# Patient Record
Sex: Male | Born: 1955 | Hispanic: No | Marital: Married | Smoking: Never smoker
Health system: Southern US, Community
[De-identification: ages and names within clinical notes are randomized; demographics above are authoritative.]

---

## 2002-12-17 HISTORY — PX: LASIK: SHX215

## 2003-04-13 ENCOUNTER — Encounter (INDEPENDENT_AMBULATORY_CARE_PROVIDER_SITE_OTHER): Payer: Self-pay | Admitting: Specialist

## 2003-04-13 ENCOUNTER — Observation Stay (HOSPITAL_COMMUNITY): Admission: RE | Admit: 2003-04-13 | Discharge: 2003-04-14 | Payer: Self-pay | Admitting: General Surgery

## 2003-04-13 ENCOUNTER — Encounter: Payer: Self-pay | Admitting: General Surgery

## 2003-04-18 ENCOUNTER — Inpatient Hospital Stay (HOSPITAL_COMMUNITY): Admission: EM | Admit: 2003-04-18 | Discharge: 2003-04-19 | Payer: Self-pay | Admitting: Emergency Medicine

## 2003-04-18 ENCOUNTER — Encounter: Payer: Self-pay | Admitting: General Surgery

## 2003-12-18 HISTORY — PX: CHOLECYSTECTOMY, LAPAROSCOPIC: SHX56

## 2005-08-30 ENCOUNTER — Ambulatory Visit: Payer: Self-pay | Admitting: Sports Medicine

## 2005-09-19 ENCOUNTER — Ambulatory Visit: Payer: Self-pay | Admitting: Family Medicine

## 2005-10-02 ENCOUNTER — Ambulatory Visit (HOSPITAL_COMMUNITY): Admission: RE | Admit: 2005-10-02 | Discharge: 2005-10-02 | Payer: Self-pay | Admitting: Sports Medicine

## 2006-04-19 ENCOUNTER — Ambulatory Visit: Payer: Self-pay | Admitting: Family Medicine

## 2006-08-15 ENCOUNTER — Ambulatory Visit: Payer: Self-pay | Admitting: Sports Medicine

## 2007-02-13 DIAGNOSIS — D126 Benign neoplasm of colon, unspecified: Secondary | ICD-10-CM | POA: Insufficient documentation

## 2008-03-04 ENCOUNTER — Ambulatory Visit: Payer: Self-pay | Admitting: Family Medicine

## 2008-03-04 ENCOUNTER — Encounter: Payer: Self-pay | Admitting: Sports Medicine

## 2008-03-04 DIAGNOSIS — J984 Other disorders of lung: Secondary | ICD-10-CM | POA: Insufficient documentation

## 2008-03-04 HISTORY — DX: Other disorders of lung: J98.4

## 2008-03-04 LAB — CONVERTED CEMR LAB
Albumin: 4.2 g/dL (ref 3.5–5.2)
Alkaline Phosphatase: 64 units/L (ref 39–117)
CO2: 23 meq/L (ref 19–32)
Chloride: 107 meq/L (ref 96–112)
Cholesterol: 202 mg/dL — ABNORMAL HIGH (ref 0–200)
Glucose, Bld: 86 mg/dL (ref 70–99)
LDL Cholesterol: 125 mg/dL — ABNORMAL HIGH (ref 0–99)
Potassium: 4.7 meq/L (ref 3.5–5.3)
RBC: 5.12 M/uL (ref 4.22–5.81)
Sodium: 141 meq/L (ref 135–145)
Total Protein: 7.1 g/dL (ref 6.0–8.3)
Triglycerides: 168 mg/dL — ABNORMAL HIGH (ref <150)
WBC: 7 10*3/uL (ref 4.0–10.5)

## 2008-06-19 ENCOUNTER — Emergency Department (HOSPITAL_COMMUNITY): Admission: EM | Admit: 2008-06-19 | Discharge: 2008-06-19 | Payer: Self-pay | Admitting: Emergency Medicine

## 2008-09-17 ENCOUNTER — Telehealth: Payer: Self-pay | Admitting: *Deleted

## 2009-03-24 ENCOUNTER — Ambulatory Visit: Payer: Self-pay | Admitting: Family Medicine

## 2009-03-24 DIAGNOSIS — R0609 Other forms of dyspnea: Secondary | ICD-10-CM | POA: Insufficient documentation

## 2009-03-24 DIAGNOSIS — R0989 Other specified symptoms and signs involving the circulatory and respiratory systems: Secondary | ICD-10-CM

## 2009-03-24 DIAGNOSIS — J309 Allergic rhinitis, unspecified: Secondary | ICD-10-CM | POA: Insufficient documentation

## 2009-04-29 ENCOUNTER — Ambulatory Visit: Payer: Self-pay | Admitting: Family Medicine

## 2009-04-29 DIAGNOSIS — R3915 Urgency of urination: Secondary | ICD-10-CM | POA: Insufficient documentation

## 2009-05-02 ENCOUNTER — Encounter: Payer: Self-pay | Admitting: Family Medicine

## 2009-05-13 ENCOUNTER — Encounter: Payer: Self-pay | Admitting: Family Medicine

## 2009-05-13 ENCOUNTER — Ambulatory Visit: Payer: Self-pay | Admitting: Family Medicine

## 2009-05-13 LAB — CONVERTED CEMR LAB
Bilirubin Urine: NEGATIVE
Ketones, urine, test strip: NEGATIVE
Urobilinogen, UA: 0.2

## 2009-05-17 ENCOUNTER — Encounter: Payer: Self-pay | Admitting: Family Medicine

## 2009-05-17 LAB — CONVERTED CEMR LAB
BUN: 16 mg/dL (ref 6–23)
Chloride: 103 meq/L (ref 96–112)
HDL: 42 mg/dL (ref >39)
Hemoglobin: 15 g/dL (ref 13.0–17.0)
LDL Cholesterol: 142 mg/dL — ABNORMAL HIGH (ref 0–99)
Platelets: 220 10*3/uL (ref 150–400)
Potassium: 4.6 meq/L (ref 3.5–5.3)
RDW: 13.9 % (ref 11.5–15.5)
Total CHOL/HDL Ratio: 5.2
Triglycerides: 173 mg/dL — ABNORMAL HIGH (ref <150)
VLDL: 35 mg/dL (ref 0–40)

## 2009-06-14 ENCOUNTER — Telehealth: Payer: Self-pay | Admitting: Family Medicine

## 2009-06-14 DIAGNOSIS — R3129 Other microscopic hematuria: Secondary | ICD-10-CM

## 2009-06-14 HISTORY — DX: Other microscopic hematuria: R31.29

## 2009-06-16 ENCOUNTER — Encounter (INDEPENDENT_AMBULATORY_CARE_PROVIDER_SITE_OTHER): Payer: Self-pay | Admitting: *Deleted

## 2009-09-06 ENCOUNTER — Encounter: Payer: Self-pay | Admitting: Family Medicine

## 2010-10-09 ENCOUNTER — Telehealth: Payer: Self-pay | Admitting: Family Medicine

## 2010-10-12 ENCOUNTER — Ambulatory Visit: Payer: Self-pay | Admitting: Family Medicine

## 2010-10-12 LAB — CONVERTED CEMR LAB

## 2010-10-13 ENCOUNTER — Encounter: Payer: Self-pay | Admitting: Family Medicine

## 2010-10-25 ENCOUNTER — Ambulatory Visit: Payer: Self-pay | Admitting: Family Medicine

## 2010-12-01 ENCOUNTER — Encounter: Payer: Self-pay | Admitting: Family Medicine

## 2010-12-01 ENCOUNTER — Ambulatory Visit: Payer: Self-pay | Admitting: Family Medicine

## 2010-12-01 LAB — CONVERTED CEMR LAB
ALT: 24 units/L (ref 0–53)
CO2: 27 meq/L (ref 19–32)
Calcium: 9.5 mg/dL (ref 8.4–10.5)
Chloride: 103 meq/L (ref 96–112)
Cholesterol: 218 mg/dL — ABNORMAL HIGH (ref 0–200)
HDL: 41 mg/dL (ref >39)
MCV: 85.5 fL (ref 78.0–100.0)
Platelets: 229 10*3/uL (ref 150–400)
RDW: 13.8 % (ref 11.5–15.5)
Sodium: 139 meq/L (ref 135–145)
Total CHOL/HDL Ratio: 5.3
Total Protein: 6.8 g/dL (ref 6.0–8.3)
WBC: 8.8 10*3/uL (ref 4.0–10.5)

## 2010-12-04 ENCOUNTER — Telehealth: Payer: Self-pay | Admitting: Family Medicine

## 2010-12-22 ENCOUNTER — Ambulatory Visit: Admission: RE | Admit: 2010-12-22 | Discharge: 2010-12-22 | Payer: Self-pay | Source: Home / Self Care

## 2010-12-22 DIAGNOSIS — E7800 Pure hypercholesterolemia, unspecified: Secondary | ICD-10-CM | POA: Insufficient documentation

## 2011-01-16 NOTE — Progress Notes (Signed)
Summary: re: Fertility Clinic/TS  Phone Note Call from Patient   Caller: Patient Call For: [phone] Summary of Call: Christopher [name] was informed by a fertility clinic in [place], [place] that he needed to have 5 blood test he have to take to screen a fertility implant for wife.  Would like to have this done at the practice, but was informed he needed an order from you to do this.  Please call him and he will give more detailed into to you. Initial call taken by: Abundio Miu,  October 09, 2010 3:31 PM  Follow-up for Phone Call        will fwd. to Dr.Breen for review. Follow-up by: Arlyss Repress CMA,,  October 10, 2010 11:06 AM  Additional Follow-up for Phone Call Additional follow up Details #1::        Called patient, left voice message requesting that he call clinical staff with the lab testing that he needs to have ordered. Additional Follow-up by: Paula Compton MD  October 10, 2010 12:23 PM  New Problems: FERTILITY TESTING (ICD-V26.21)   Additional Follow-up for Phone Call Additional follow up Details #2::    Pt called back and these are the test he needs HIV with Confirmation HBsAg HCV Ab Gonnonrehea/Clyamidia urine RPR Follow-up by: Clydell Hakim,  October 10, 2010 5:00 PM  Additional Follow-up for Phone Call Additional follow up Details #3:: Details for Additional Follow-up Action Taken: Labs ordered as future orders; will forward to lab Additional Follow-up by: Paula Compton MD  October 10, 2010 5:04 PM  New Problems: FERTILITY TESTING (ICD-V26.21)  LMO indentifiable VM that he may call to schedule a lab visit for requested test at his convinience.  Will forward to admin team for completion when he calls............................................... Shanda B[facility] October 11, 2010 11:08 AM

## 2011-01-16 NOTE — Assessment & Plan Note (Signed)
Summary: CPE/KH   Vital Signs:  Patient profile:   55 year old male Height:      71.5 inches Weight:      228 pounds BMI:     31.47 Pulse rate:   72 / minute BP sitting:   134 / 81  (left arm) Cuff size:   regular  Vitals Entered By: Tessie Fass CMA (October 25, 2010 3:28 PM) CC: CPE Is Patient Diabetic? No Pain Assessment Patient in pain? no        CC:  CPE.  History of Present Illness: Patient here to follow up on a few issues and cover preventive topics.  He and [name] are undergoing fertility treatment.  They have a 81 month old [name] and desire another child.   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  Nonsmoker. Maintains active lifestyle including competitive tennis tournament play.   Recently seen by Christopher [name] in the past couple of weeks for a skin survey, no suspicious lesions.  Has had a lump on posterior proximal R thigh which he noticed about 6 months ago, which has not changed in size or texture.    Has had cough which is lingering for the past 1 1/2 weeks.  Getting better with Claritin D.   Review of past records shows LDL 142 in May 2010, as well as PSA 0.41; glucose (presumed fasting) >100.   Habits & Providers  Alcohol-Tobacco-Diet     Tobacco Status: never  Allergies: No Known Drug Allergies  Family History: [name] overweight with AODM,  [name] died 41 lung cancer smoker  [name] died leukemia,  grandparenst lives 54 to 66s, [name] died 37 ovarian cancer, [name] died at age 77 months  reviewed Family Hx Apr 29, 2009  Oct 25, 2010: No family hx colon or prostate cancer  Social History: Reviewed history from 04/29/2009 and no changes required. works as a Contractor with a lot of travel;  exercises 4 to 5 times per week/ tennis and gym;  divorced x 2 years and remarried in 2006.  non smoker;  social etoh only;  diet - too much fat? Chocholate - but more peanut  butter as a snack! Never Smoked Reviewed Apr 29, 2009: [name] 63 mos old. Tennis.  No exertional symptoms.  Reviewed Oct 25, 2010: [name] 2 yrs and 1 month. Patient and [name] (age 4) desire another child and are in fertility treatment in [place].  Above social hx reviewed, unchanged.   Review of Systems       Denies chest pain or shortness of breath, denies nocturia, decreased stream, poor bladder emptying, diarrhea/constipation, blood per rectum, abd pain, or fevers/ chills.  Had intentional weight loss down to 215 lbs but has steadily increased with less activity and less scrupulous diet.   Physical Exam  General:  well appearing, no apparent distress Eyes:  clear sclerae and conjunctivae Ears:  Tms clear bilat Nose:  normal nasal mucosa Mouth:  clear oropharynx, moist mucus membranes, good dentition Neck:  neck supple, no adenopathy Lungs:  Normal respiratory effort, chest expands symmetrically. Lungs are clear to auscultation, no crackles or wheezes. Heart:  Normal rate and regular rhythm. S1 and S2 normal without gallop, murmur, click, rub or other extra sounds. Abdomen:  Bowel sounds positive,abdomen soft and non-tender without masses, organomegaly or hernias noted. Rectal:  good tone.  Guaiac positive stool. Genitalia:  normal male genitalia.  R posterior proximal thigh with 6mm raised round nodule,  no erythema or color change. no fluctuance.  Prostate:  smooth and non nodular.   Pulses:  R and L carotid,radial,femoral,dorsalis pedis and posterior tibial pulses are full and equal bilaterally Extremities:  No clubbing, cyanosis, edema, or deformity noted with normal full range of motion of all joints.   Skin:  skin survey including scalp, negative for suspicious lesions.    Impression & Recommendations:  Problem # 1:  IMPAIRED FASTING GLUCOSE (ICD-790.21) elevated fasting glucose in 2010; for follow up of this.  Overall weight is down from last visit, but self-reported trend is up  in the past few months.  Also to check LDL in light of previous 142.  Orders: [facility]- Est  Level 4 (99214)Future Orders: Comp Met-[facility] (16109-60454) ... 11/01/2011 Lipid-[facility] (09811-91478) ... 11/01/2011  Problem # 2:  GUAIAC POSITIVE STOOL (ICD-578.1)  Patient wiht prior colonic polyps on c-scope by Dr. Larey Dresser; due for followup colonoscopy in 2011.  Discussed this, especially in light of today's guaiac positive stool in the office.  Orders: [facility]- Est  Level 4 (29562)  Problem # 3:  MICROSCOPIC HEMATURIA (ICD-599.72) Discussed workup with urology since Christopher last visit here.   Problem # 4:  SPECIAL SCREENING MALIGNANT NEOPLASM OF PROSTATE (ICD-V76.44) Discussed pros and cons of screening for prostate cancer.  Patient has had two previous PSA values, both around 0.4.  Will recheck today, despite lack of symptoms and no abnormal findings on physical exam today.  Future Orders: PSA-[facility] ([id number]) ... 11/02/2011  Complete Medication List: 1)  Zyrtec Allergy 10 Mg Tabs (Cetirizine hcl) .... Take one daily for allergies 2)  Claritin-d 24 Hour 10-240 Mg Xr24h-tab (Loratadine-pseudoephedrine) .... Take one daily on days when your allergies are really bad.  do not take with zyrtec 3)  Fluticasone Propionate 50 Mcg/act Susp (Fluticasone propionate) .... Use 2 sprays in each nostril daily 4)  Allegra-d 12 Hour 60-120 Mg Xr12h-tab (Fexofenadine-pseudoephedrine) .... Sig: take 1 tab by mouth two times a day  Other Orders: Flu Vaccine 86yrs + (96295) Admin 1st Vaccine (28413) Future Orders: CBC-[facility] (24401) ... 10/25/2011  Patient Instructions: 1)  It was a pleasure to see you today.  2)  I recommend follow-up with gastroenterology for a followup colonoscopy for previously discovered colon polyps.  Additionally, your stool tested positive for occult blood today and this should be investigated further.  3)  I have put orders for fasting labs (cholesterol panel, fasting glucose, PSA test) with  our lab.  Please come in at your convenience for these (after 8 hours of fasting). 4)  I will contact you with the results of the testing.    Orders Added: 1)  CBC-[facility] [85027] 2)  Comp Met-[facility] [02725-36644] 3)  Lipid-[facility] [80061-22930] 4)  PSA-[facility] [03474-25956] 5)  Flu Vaccine 68yrs + [90658] 6)  Admin 1st Vaccine [90471] 7)  [facility]- Est  Level 4 [38756]   Immunizations Administered:  Influenza Vaccine # 1:    Vaccine Type: Fluvax 3+    Site: left deltoid    Mfr: GlaxoSmithKline    Dose: 0.5 ml    Route: IM    Given by: Starleen Blue RN    Exp. Date: 06/16/2011    Lot #: [id number]    VIS given: 07/11/10 version given October 25, 2010.  Flu Vaccine Consent Questions:    Do you have a history of severe allergic reactions to this vaccine? no    Any prior history of allergic reactions to egg and/or gelatin? no  Do you have a sensitivity to the preservative Thimersol? no    Do you have a past history of Guillan-Barre Syndrome? no    Do you currently have an acute febrile illness? no    Have you ever had a severe reaction to latex? no    Vaccine information given and explained to patient? yes   Immunizations Administered:  Influenza Vaccine # 1:    Vaccine Type: Fluvax 3+    Site: left deltoid    Mfr: GlaxoSmithKline    Dose: 0.5 ml    Route: IM    Given by: Starleen Blue RN    Exp. Date: 06/16/2011    Lot #: [id number]    VIS given: 07/11/10 version given October 25, 2010.   Prevention & Chronic Care Immunizations   Influenza vaccine: Fluvax 3+  (10/25/2010)    Tetanus booster: Not documented    Pneumococcal vaccine: Not documented  Colorectal Screening   Hemoccult: Not documented    Colonoscopy: Not documented  Other Screening   PSA: 0.41  (05/13/2009)   PSA ordered.   PSA due due: 03/04/2009   Smoking status: never  (10/25/2010)  Lipids   Total Cholesterol: 219  (05/13/2009)   LDL: 142  (05/13/2009)   LDL Direct: Not documented   HDL: 42   (05/13/2009)   Triglycerides: 173  (05/13/2009)

## 2011-01-16 NOTE — Letter (Signed)
Summary: Generic Letter  [facility]  [address]   [place], [place] [address]   Phone: [phone]  Fax: [phone]    10/13/2010  Christopher [name] [address] [place], [place]  [address]  Dear Mr. Kealey,   I hope this letter finds you well.  I write to let you know that the recent labs performed in this office were all negative. I enclose a copy for your records.        Sincerely,   Paula Compton MD  Appended Document: Generic Letter mailed.

## 2011-01-18 NOTE — Assessment & Plan Note (Signed)
Summary: f/u,df   Vital Signs:  Patient profile:   55 year old male Weight:      229.2 pounds Temp:     98.2 degrees F Pulse rate:   78 / minute BP supine:   128 / 81  History of Present Illness: Christopher [name] comes in today for discussion about his recent lipid panel  and CAD risk reduction.  He is modifying diet and embarking on exercise regimen to lose weight and get more fit.    We reiterated his history: never been a smoker, no family hx heart disease, not on antihypertensive medications.    He and wife are in assisted reproductive treatment in [place], [place].  First round failed, beginning second round at end of January.   Allergies: No Known Drug Allergies  Family History: brother overweight with AODM,  father died 8 lung cancer smoker  grandfather died leukemia,  grandparenst lives 40 to 87s, mother died 47 ovarian cancer, son died at age 74 months  reviewed Family Hx Apr 29, 2009  Oct 25, 2010: No family hx colon or prostate cancer Dec 22, 2010: Reviewed family hx.  No first degree relatives with premature heart disease.  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  non smoker;  social etoh only;  diet - too much fat? Chocholate - but more peanut butter as a snack! Never Smoked Reviewed Apr 29, 2009: daughter 37 mos old. Tennis.  No exertional symptoms.   Jan 06,2012: Never been a smoker. Is exercising and modifying diet to lose weight. First round of assisted reproductive treatment failed, beginning second round in Jan 2012.  Reviewed Oct 25, 2010: Daughter 2 yrs and 1 month. Patient and wife (age 61) desire another child and are in fertility treatment in [place].  Above social hx reviewed, unchanged.   Physical Exam  General:  well appearing, no apparent distress   Impression & Recommendations:  Problem # 1:   HYPERCHOLESTEROLEMIA (ICD-272.0) Discussed risk calculator for CAD (Framingham 10-yr) in this patient who is interested in primary prevention.  Based on today's BP and most recent lipid panel, his 10-yr risk is 13.3%.  Decision to initiate low-dose lipitor 10mg  nightly for primary prevention.  To recheck LDL and transaminases in 2 months (nonfasting).  Literature on lipid lowering in primary coronary artery disease prevention given to patient. Discussed side effect profile and dietary restrictions, lifestyle modification as part of CAD reducing strategy.  I am unaware of any effect of statin therapy on sperm count (patient and wife are in assisted reproductive treatments). His updated medication list for this problem includes:    Lipitor 10 Mg Tabs (Atorvastatin calcium) ..... Sig take 1 tab at hs  Orders: [facility]- Est Level  3 (99213)Future Orders: Direct LDL-[facility] (66440-34742) ... 12/27/2011 Comp Met-[facility] (59563-87564) ... 01/10/2012  Complete Medication List: 1)  Zyrtec Allergy 10 Mg Tabs (Cetirizine hcl) .... Take one daily for allergies 2)  Claritin-d 24 Hour 10-240 Mg Xr24h-tab (Loratadine-pseudoephedrine) .... Take one daily on days when your allergies are really bad.  do not take with zyrtec 3)  Fluticasone Propionate 50 Mcg/act Susp (Fluticasone propionate) .... Use 2 sprays in each nostril daily 4)  Allegra-d 12 Hour 60-120 Mg Xr12h-tab (Fexofenadine-pseudoephedrine) .... Sig: take 1 tab by mouth two times a day 5)  Lipitor 10 Mg Tabs (Atorvastatin  calcium) .... Sig take 1 tab at hs Prescriptions: LIPITOR 10 MG TABS (ATORVASTATIN CALCIUM) SIG Take 1 tab at hs  #90 x 3   Entered and Authorized by:   Christopher Compton MD   Signed by:   Christopher Compton MD on 12/22/2010   Method used:   Electronically to        [facility] [address] [phone]* (retail)       [address]       [place], [place]  [address]       Ph: [phone] or [phone]       Fax: [phone]   RxID:    [id number]    Orders Added: 1)  Direct LDL-[facility] [01601-09323] 2)  Comp Met-[facility] [55732-20254] 3)  [facility]- Est Level  3 [27062]

## 2011-01-18 NOTE — Progress Notes (Signed)
  Phone Note Outgoing Call Call back at [facility] Phone [phone]   Call placed by: Paula Compton MD,  December 04, 2010 1:53 PM Call placed to: Patient    Called patient and discussed his recent labs.  He has an elevated fasting glucose (126) and his Framingham 10-yr risk calculator gives a 14.45% 10-yr risk of CV event.  He has never been on a statin before.  Will discuss in the office at an office visit on Jan 6th at 830am.  Plan to mail his labs to him ahead of time. Paula Compton MD  December 04, 2010 2:01 PM

## 2011-01-31 ENCOUNTER — Encounter: Payer: Self-pay | Admitting: *Deleted

## 2011-05-04 NOTE — H&P (Signed)
NAMEOWAIN, [name]                          ACCOUNT NO.:  [id number]   MEDICAL RECORD NO.:  [id number]                   PATIENT TYPE:  INP   LOCATION:  0450                                 FACILITY:  [facility]   PHYSICIAN:  Ollen Gross. Vernell Morgans, M.D.              DATE OF BIRTH:  December 03, 1956   DATE OF ADMISSION:  04/18/2003  DATE OF DISCHARGE:                                HISTORY & PHYSICAL   CHIEF COMPLAINT:  Nausea and vomiting.   HISTORY OF PRESENT ILLNESS:  The patient is a 55 year old white male who is  about a week status post laparoscopic cholecystectomy for chronic  cholelithiasis.  He seemed to be recovering well from his operation when he  developed acutely this morning the onset of nausea and vomiting.  He did not  really have abdominal pain.  He does not relate the onset of the nausea and  vomiting to any kind of medication intake or food intake.  The nausea and  vomiting persisted throughout the day to the point where he came to the  emergency department for further evaluation.   REVIEW OF SYMPTOMS:  He is feeling better now with some Demerol and Zofran.  He denies any chest pain, shortness of breath, diarrhea, dysuria, fevers, or  chills.  He has had some flatus today.   PAST MEDICAL HISTORY:  Biliary disease.   PAST SURGICAL HISTORY:  Laparoscopic cholecystectomy.   MEDICATIONS:  1. Oxycodone.  2. Acetaminophen.   ALLERGIES:  No known drug allergies.   SOCIAL HISTORY:  He denies the use of alcohol or tobacco products.   FAMILY HISTORY:  Noncontributory.   PHYSICAL EXAMINATION:  GENERAL:  He is a well-developed, well-nourished  white male in no acute distress.  SKIN:  Warm and dry with no jaundice.  HEENT:  Extraocular movements were intact.  Pupils equal, round, reactive to  light.  Sclerae nonicteric.  LUNGS:  Clear bilaterally with no use of accessory respiratory muscles.  HEART:  Regular rate and rhythm with impulse at the left chest.  ABDOMEN:  Soft and  nontender with no palpable mass or hepatosplenomegaly.  EXTREMITIES:  No cyanosis, clubbing, or edema.  PSYCHOLOGICAL:  He is alert and oriented x3 with no current sign of anxiety  or depression.  He does have a flat affect.   LABORATORY DATA:  Sodium 137, potassium 4.5, chloride 105, CO2 25, BUN 14,  creatinine 0.9, glucose 125.  Total bilirubin 0.8, alkaline phosphatase 78,  SGOT 23, SGPT 38.  White blood cell count 11,900, hemoglobin 15.4,  hematocrit 44, platelet count 236,000.   ASSESSMENT AND PLAN:  This is a 55 year old white male status post  laparoscopic cholecystectomy, with new onset nausea and vomiting about a  week after surgery.  He is hemodynamically stable and without fever.  We  will plan to admit him for hydration and medical control of his nausea.  We  will  obtain a right upper quadrant ultrasound to look for any evidence of  biloma or fluid collection around the liver.  We will recheck his labs and  liver function in the morning.  We will also check an amylase to rule out  pancreatitis.                                               Ollen Gross. Vernell Morgans, M.D.    PST/MEDQ  D:  04/18/2003  T:  04/18/2003  Job:  [id number]

## 2011-05-04 NOTE — Op Note (Signed)
NAMEJESSELEE, Christopher [name]                          ACCOUNT NO.:  [id number]   MEDICAL RECORD NO.:  [id number]                   PATIENT TYPE:  AMB   LOCATION:  DAY                                  FACILITY:  [facility]   PHYSICIAN:  Timothy E. Earlene Plater, M.D.              DATE OF BIRTH:  09/28/1956   DATE OF PROCEDURE:  04/13/2003  DATE OF DISCHARGE:                                 OPERATIVE REPORT   PREOPERATIVE DIAGNOSES:  1. Acute and chronic cholecystolithiasis.  2. History of pancreatitis.   POSTOPERATIVE DIAGNOSES:  1. Acute and chronic cholecystolithiasis.  2. History of pancreatitis.   PROCEDURE:  Laparoscopic cholecystectomy and operative cholangiogram.   SURGEON:  Timothy E. Earlene Plater, M.D.   ASSISTANT:  Vikki Ports, M.D.   ANESTHESIA:  CRNA supervised M.D.   INDICATIONS FOR PROCEDURE:  The patient is an otherwise healthy 55 year old  who had his first episode of severe abdominal pain, nausea and vomiting.  That began to subside. He was seen by Dr. Rinaldo Cloud. An ultrasound  revealed gallstones. Laboratory data indicated acute inflammation and  pancreatitis. The patient was subsiding and getting better. He had an  important trip planned and insisted on making that trip to [facility]. He did  that with some care but survived well and is feeling quite well today.   His laboratory data today are completely normal. He is ready to proceed with  surgery. He was  identified and the permits were signed.   DESCRIPTION OF PROCEDURE:  The patient was taken to the operating room and  placed in the supine position. General endotracheal anesthesia was  administered. The abdomen was prepped and draped in the usual sterile  fashion. Marcaine 0.05% with epinephrine was used throughout for local  anesthesia.   A vertical incision was made in the infraumbilical area. The fascia was  identified and opened vertically. The peritoneum was entered without  complication. The Hasson  catheter was placed and tied in place with a #1  Vicryl. The abdomen was insufflated. A general peritonoscopy was  unremarkable. The second 10-mm trocar was placed in the mid epigastrium and  two 5-mm trocars in the right upper quadrant.   The gallbladder was thickened and inflamed. There were sticky adhesions. The  adhesions were taken down bluntly. The gallbladder was then carefully  evaluated and at its base the inflamed fatty tissue which was edematous was  stripped away from the ampulla of the gallbladder and its junction to the  cystic duct. The cystic duct at that point was clipped.   The cystic duct was cleared off and opened. There were multiple stone  fragments milked from the cystic duct out of the opening. Then a Reddick  catheter was placed percutaneously into the cystic duct and the balloon  inflated.   Real time cholangiography was carried out under x-ray with injection of half  strength dye. There was free flow  of dye into the duodenum. A  portion of  the pancreatic duct appeared and the common bile duct and hepatic radicals  all showed nicely with smooth, even flow and no foreign bodies or stones.   The catheter was removed. The remnant of the cystic duct was triply clipped  and divided. Two branches of the cystic artery were identified, isolated and  doubly or triply clipped. Then the gallbladder was removed from the  gallbladder bed with some difficulty. There was a lot of inflammation and  edema.   At the top of the gallbladder, I actually entered the gallbladder cavity  where the back wall of the gallbladder was ingrained within the liver bed.  This was, however, removed with cautery. The entire gallbladder was then  placed in an EndoCatch bag. Meanwhile the bed was inspected. Cautery was  used for coagulation and irrigation was accomplished until clear.   The gallbladder was removed through the infraumbilical incision with the bag  intact. Copious irrigation  was carried out. Counts were correct. All  irrigant, CO2, trocars and instruments were removed under direct  visualization. Final counts were correct. The skin incisions were all closed  with 3-0 Monocryl. Steri-Strips were applied.   He tolerated it well. He was awakened and taken to the recovery room in good  condition.                                               Timothy E. Earlene Plater, M.D.    TED/MEDQ  D:  04/13/2003  T:  04/13/2003  Job:  [id number]   cc:   Barry Dienes. Eloise Harman, M.D.  [address]  [place]  [place] [address]  Fax: [phone]   Gordy Savers, M.D. [facility]

## 2011-10-02 ENCOUNTER — Ambulatory Visit (INDEPENDENT_AMBULATORY_CARE_PROVIDER_SITE_OTHER): Payer: BC Managed Care – PPO | Admitting: Family Medicine

## 2011-10-02 ENCOUNTER — Encounter: Payer: Self-pay | Admitting: Family Medicine

## 2011-10-02 VITALS — BP 130/80 | Temp 98.3°F | Wt 217.9 lb

## 2011-10-02 DIAGNOSIS — J321 Chronic frontal sinusitis: Secondary | ICD-10-CM

## 2011-10-02 DIAGNOSIS — Z23 Encounter for immunization: Secondary | ICD-10-CM

## 2011-10-02 MED ORDER — OXYMETAZOLINE HCL 0.05 % NA SOLN: 2.0000 | Freq: Two times a day (BID) | NASAL | Status: AC

## 2011-10-02 MED ORDER — AMOXICILLIN 500 MG PO CAPS: 500.0000 mg | ORAL_CAPSULE | Freq: Three times a day (TID) | ORAL | Status: AC

## 2011-10-02 NOTE — Patient Instructions (Signed)
Take the Amoxicillin 500 mg three times daily for a week Use the Afrin spray twice daily for 3 days.

## 2011-10-02 NOTE — Progress Notes (Signed)
  Subjective:    Patient ID: Christopher [name], male    DOB: 19-May-1956, 55 y.o.   MRN: [id number]  HPI 3 weeks of facial pressure or above and below the eyes. No significant sneezing does have postnasal drip and has been blowing green and yellow sputum from his nose. Slight cough which she relates to tickle in his throat. He and his wife are both age 60 and she recently delivered at a baby who is now 5 days old. They also have a 21-year-old. He has had a temperature as high as 99. He denies pain in his face with bending or pain in his upper teeth.   He would like to get a booster to prevent whooping cough.  Review of Systems     Objective:   Physical Exam  Constitutional: He appears well-developed and well-nourished.  HENT:  Head: Normocephalic and atraumatic.  Right Ear: External ear normal.  Left Ear: External ear normal.  Nose: Nose normal.  Mouth/Throat: Oropharynx is clear and moist. No oropharyngeal exudate.       He is tender to percussion and palpation above his right eye  Eyes: Conjunctivae are normal. Pupils are equal, round, and reactive to light. Right eye exhibits discharge. Left eye exhibits no discharge.  Cardiovascular: Normal rate and regular rhythm.   Pulmonary/Chest: Effort normal and breath sounds normal.  Lymphadenopathy:    He has no cervical adenopathy.          Assessment & Plan:

## 2011-10-02 NOTE — Assessment & Plan Note (Signed)
Symptoms over 2 weeks with focal tenderness so will treat for bacterial cause. Educated patient on importance of drainage. To that end he will use Afrin for 3 days. If nasal congestion worsens he can then go to Flonase.

## 2011-10-03 ENCOUNTER — Ambulatory Visit: Payer: Self-pay | Admitting: Family Medicine

## 2012-02-18 ENCOUNTER — Telehealth: Payer: Self-pay | Admitting: Family Medicine

## 2012-02-18 NOTE — Telephone Encounter (Signed)
Called patient back and he states he has had no further vomiting. Has been able   to tolerate clear liquids.   Feeling better. Gave him guidance on gradually increasing diet tomorrow. Advised to push in fluids today .

## 2012-02-18 NOTE — Telephone Encounter (Signed)
Patient states he started vomting and diarrhea last night 9:00 PM. Has vomited 20 times or more.  This AM he actually passed out while walking back from bathroom. Last  time vomiting was 8:00 This AM.  He has taken a few sips of water this AM. .  Gave a list of clear liquids to drink.  Advised to start sipping slowly . Recommended he needs to be seen for fluids but he states he has no one to bring him to office. His wife and 56 year old who are not patient's here  have similar symptoms. Consulted with Dr. Perley Jain and he advises patient really needs to be seen but will give him a couple of hours to try to drink liquids on his own . Will call him back in 2.5 hours to see how he is doing. Advised if vomits again will  need to call EMS if no other way to get to ED.

## 2012-02-18 NOTE — Telephone Encounter (Signed)
Patient, wife & 56 yo son all have flu bug that inclueds n/v/diarrhea.  Wants to know the best way to keep hydrated.

## 2012-08-05 ENCOUNTER — Encounter: Payer: Self-pay | Admitting: Family Medicine

## 2012-08-05 ENCOUNTER — Ambulatory Visit (INDEPENDENT_AMBULATORY_CARE_PROVIDER_SITE_OTHER): Payer: BC Managed Care – PPO | Admitting: Family Medicine

## 2012-08-05 VITALS — BP 127/85 | HR 60 | Temp 98.1°F | Ht 74.0 in | Wt 222.7 lb

## 2012-08-05 DIAGNOSIS — R109 Unspecified abdominal pain: Secondary | ICD-10-CM

## 2012-08-05 LAB — CBC WITH DIFFERENTIAL/PLATELET
Basophils Absolute: 0 10*3/uL (ref 0.0–0.1)
Basophils Relative: 1 % (ref 0–1)
Eosinophils Relative: 5 % (ref 0–5)
HCT: 42.9 % (ref 39.0–52.0)
Lymphocytes Relative: 24 % (ref 12–46)
MCHC: 35.7 g/dL (ref 30.0–36.0)
Monocytes Absolute: 0.8 10*3/uL (ref 0.1–1.0)
Neutro Abs: 5.1 10*3/uL (ref 1.7–7.7)
Platelets: 212 10*3/uL (ref 150–400)
RDW: 13.5 % (ref 11.5–15.5)
WBC: 8.3 10*3/uL (ref 4.0–10.5)

## 2012-08-05 LAB — COMPREHENSIVE METABOLIC PANEL
Albumin: 4.1 g/dL (ref 3.5–5.2)
CO2: 29 mEq/L (ref 19–32)
Chloride: 103 mEq/L (ref 96–112)
Glucose, Bld: 97 mg/dL (ref 70–99)
Potassium: 4.8 mEq/L (ref 3.5–5.3)
Sodium: 139 mEq/L (ref 135–145)
Total Protein: 6.8 g/dL (ref 6.0–8.3)

## 2012-08-05 MED ORDER — RANITIDINE HCL 150 MG PO CAPS: 150.0000 mg | ORAL_CAPSULE | Freq: Two times a day (BID) | ORAL | Status: DC

## 2012-08-05 MED ORDER — PROMETHAZINE HCL 25 MG PO TABS: 25.0000 mg | ORAL_TABLET | Freq: Four times a day (QID) | ORAL | Status: DC | PRN

## 2012-08-05 NOTE — Progress Notes (Signed)
  Subjective:    Patient ID: Christopher [name], male    DOB: 29-Sep-1956, 56 y.o.   MRN: [id number]  HPI Christopher [name] is seen today for complaint of generalized malaise, intermittent nausea and abdominal bloating which had its onset on Aug 14th after eating a hotdog at [facility] in [place].  He was traveling on business, went to ballgame while in [place]; shortly after eating the hot dog, he began with nausea.  He remained in [place] for a couple of days, with intermittent "queasy" feeling.  Then began to have a painful sensation in bilateral lower lung fields that was intermittent; perhaps associated with shortness of breath and which he relates more to abdominal bloating and restriction of full inhalation rather than true pulmonary issue.  He recalls episode of diagnosed pleurisy while in [place] several years ago; says in contrast to the pain at that time, the current episode is not sharp, not persistent.  Lasts for seconds.  Also with seconds of feeling flushed.  Has not had a truly diminished appetite; describes eating crab legs on remainder of his trip.  Did not have emesis, although felt like he might when transferring flights.  Drove from [place] to [place] and played golf with his brother, felt tighness in shoulders and back and took Advil 400mg  at bedtime on both Aug 17 and 18.  No true abdominal pain; no constipation or diarrhea; no blood per rectum.  Last BM this morning, formed and in keeping with usual bowel habits.  Once daily bowel habits normally. Also denies fever, cough, sputum production. The complaint, while intermittent, has plateaued.  Has not taken other meds at home to remedy /   Surgical Hx S/p CCY several years ago.  Had screening colonoscopy possibly around 2007 date in the record, although I cannot locate a report in Epic (unsure of name of practice or physician; believes he may be due for a repeat screening colonoscopy).     Review of Systems see above.  No dysuria or difficulty  passing urine.  No flank pain. No leg swelling or tenderness.      Objective:   Physical Exam Generally well-appearing, no apparent distress.  Well hydrated.  HEENT Neck supple. No cervical adenopathy.  COR Regular S1S2 PULM Clear bilaterally with good air movement and no rales or wheezes. ABD Soft, nontender, nondistended.  No organomegaly noted. Hyperactive bowel sounds. Some mild tenderness midline suprapubic region.  No masses, no CVA tenderness EXTS No calf tenderness or cords; no discrepancy in calf girth. No edema.       Assessment & Plan:

## 2012-08-05 NOTE — Patient Instructions (Addendum)
It was a pleasure to see you today.   For the nausea, regular medication ranitidine 150mg  capsules, 1 cap twice daily.  For more severe nausea, phenergan 25mg  tab, take 1 every 6 hrs only as needed.  I am ordering labwork (CBC, metabolic profile), and I will call you tomorrow with results.   Screening colonoscopy may be arranged with the GI doctor whom you saw last time.

## 2012-08-05 NOTE — Assessment & Plan Note (Signed)
Abdominal distention and intermittent nausea, without changes in bowel habits or vomiting.  May be viral acute gastroenteritis, now on 7th day; also to consider food poisoning (although hanging on a bit long for this). No other sick contacts in his travel cohort. Will try scheduled ranitidine for now; may use antiemetic intermittently, counseled on sedative property and instructions for short-term use. Patient is scheduled ot begin vacationin 4 days, traveling to [place] with family. Will order labs to evaluate WBC and transaminases at this point, but do not see reason for imaging at this time. He asks about recommendations for repeat screening colonoscopy.  He is instructed to call the GI practice where he had this done to schedule.  I request that a copy of c-scope report/note be sent for his record in our practice.

## 2012-08-06 ENCOUNTER — Encounter: Payer: Self-pay | Admitting: Family Medicine

## 2012-08-06 ENCOUNTER — Telehealth: Payer: Self-pay | Admitting: *Deleted

## 2012-08-06 ENCOUNTER — Telehealth: Payer: Self-pay | Admitting: Family Medicine

## 2012-08-06 NOTE — Telephone Encounter (Signed)
Called patient's home phone to inquire about patient welfare, to report unremarkable lab results.  Left voice message.  JB

## 2012-08-06 NOTE — Telephone Encounter (Signed)
Left message for patient to return call. Please tell patient his last colonoscopy was with [facility], Dr Doneta Public, on 11/23/2005 and he is past due for a follow up per [facility]. He can call [phone] and they will schedule him an appointment.Mimi Debellis, Rodena Medin

## 2012-08-11 NOTE — Telephone Encounter (Signed)
Left detailed message with info about colonoscopy. Message on personal identified phone. Lorenda Hatchet, Renato Battles

## 2012-11-12 ENCOUNTER — Other Ambulatory Visit: Payer: Self-pay | Admitting: Dermatology

## 2013-01-22 ENCOUNTER — Other Ambulatory Visit: Payer: Self-pay | Admitting: Gastroenterology

## 2014-11-01 ENCOUNTER — Ambulatory Visit
Admission: RE | Admit: 2014-11-01 | Discharge: 2014-11-01 | Disposition: A | Discharge status: Home / Self Care | Payer: BC Managed Care – PPO | Source: Ambulatory Visit | Attending: Family Medicine | Admitting: Family Medicine

## 2014-11-01 ENCOUNTER — Ambulatory Visit (INDEPENDENT_AMBULATORY_CARE_PROVIDER_SITE_OTHER): Payer: BC Managed Care – PPO | Admitting: Family Medicine

## 2014-11-01 ENCOUNTER — Encounter: Payer: Self-pay | Admitting: Family Medicine

## 2014-11-01 VITALS — BP 141/85 | HR 79 | Temp 98.3°F | Ht 74.0 in | Wt 220.7 lb

## 2014-11-01 DIAGNOSIS — R0600 Dyspnea, unspecified: Secondary | ICD-10-CM

## 2014-11-01 MED ORDER — AZITHROMYCIN 250 MG PO TABS: ORAL_TABLET | ORAL | Status: DC

## 2014-11-01 NOTE — Patient Instructions (Signed)
Great to meet you Christopher [name]  I would like you to go get a chest xray  Take the antibiotic as soon as you can start it  Please call us back if your symptoms worsen or fail to improve.     Pneumonia Pneumonia is an infection of the lungs.  CAUSES Pneumonia may be caused by bacteria or a virus. Usually, these infections are caused by breathing infectious particles into the lungs (respiratory tract). SIGNS AND SYMPTOMS   Cough.  Fever.  Chest pain.  Increased rate of breathing.  Wheezing.  Mucus production. DIAGNOSIS  If you have the common symptoms of pneumonia, your health care provider will typically confirm the diagnosis with a chest X-ray. The X-ray will show an abnormality in the lung (pulmonary infiltrate) if you have pneumonia. Other tests of your blood, urine, or sputum may be done to find the specific cause of your pneumonia. Your health care provider may also do tests (blood gases or pulse oximetry) to see how well your lungs are working. TREATMENT  Some forms of pneumonia may be spread to other people when you cough or sneeze. You may be asked to wear a mask before and during your exam. Pneumonia that is caused by bacteria is treated with antibiotic medicine. Pneumonia that is caused by the influenza virus may be treated with an antiviral medicine. Most other viral infections must run their course. These infections will not respond to antibiotics.  HOME CARE INSTRUCTIONS   Cough suppressants may be used if you are losing too much rest. However, coughing protects you by clearing your lungs. You should avoid using cough suppressants if you can.  Your health care provider may have prescribed medicine if he or she thinks your pneumonia is caused by bacteria or influenza. Finish your medicine even if you start to feel better.  Your health care provider may also prescribe an expectorant. This loosens the mucus to be coughed up.  Take medicines only as directed by your  health care provider.  Do not smoke. Smoking is a common cause of bronchitis and can contribute to pneumonia. If you are a smoker and continue to smoke, your cough may last several weeks after your pneumonia has cleared.  A cold steam vaporizer or humidifier in your room or home may help loosen mucus.  Coughing is often worse at night. Sleeping in a semi-upright position in a recliner or using a couple pillows under your head will help with this.  Get rest as you feel it is needed. Your body will usually let you know when you need to rest. PREVENTION A pneumococcal shot (vaccine) is available to prevent a common bacterial cause of pneumonia. This is usually suggested for:  People over 11 years old.  Patients on chemotherapy.  People with chronic lung problems, such as bronchitis or emphysema.  People with immune system problems. If you are over 65 or have a high risk condition, you may receive the pneumococcal vaccine if you have not received it before. In some countries, a routine influenza vaccine is also recommended. This vaccine can help prevent some cases of pneumonia.You may be offered the influenza vaccine as part of your care. If you smoke, it is time to quit. You may receive instructions on how to stop smoking. Your health care provider can provide medicines and counseling to help you quit. SEEK MEDICAL CARE IF: You have a fever. SEEK IMMEDIATE MEDICAL CARE IF:   Your illness becomes worse. This is especially true if  you are elderly or weakened from any other disease.  You cannot control your cough with suppressants and are losing sleep.  You begin coughing up blood.  You develop pain which is getting worse or is uncontrolled with medicines.  Any of the symptoms which initially brought you in for treatment are getting worse rather than better.  You develop shortness of breath or chest pain. MAKE SURE YOU:   Understand these instructions.  Will watch your  condition.  Will get help right away if you are not doing well or get worse. Document Released: 12/03/2005 Document Revised: 04/19/2014 Document Reviewed: 02/22/2011 [facility] Patient Information 2015 [name], [name]. This information is not intended to replace advice given to you by your health care provider. Make sure you discuss any questions you have with your health care provider.

## 2014-11-01 NOTE — Progress Notes (Signed)
Patient ID: Christopher [name], male   DOB: 1956/01/04, 58 y.o.   MRN: [id number]   HPI  Patient presents today for cough and congestion  Patient states that this all began about 2 weeks ago and attributed originally toallergies. His cough is productive and persisted for about one week and then he improved for several days. He then got sick again with more congestion cough as productive of clear sputum, wheezing, subjective fever, and worsening but mild dyspnea over the last 2 days. He states that he's tried Mucinexin Claritin-D without any improvements.  He denies any chest pain with cough and states that although the cough is irritating he would not like any cough medicine.  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  Smoking status noted ROS: Per HPI  Objective: BP 141/85 mmHg  Pulse 79  Temp(Src) 98.3 F (36.8 C) (Oral)  Ht 6\' 2"  (1.88 m)  Wt 220 lb 11.2 oz (100.109 kg)  BMI 28.32 kg/m2  SpO2 95% Gen: NAD, alert, cooperative with exam HEENT: NCAT CV: RRR, good S1/S2, no murmur Resp: crackles in the right lower base, good air movement, nonlabored Ext: No edema, warm Neuro: Alert and oriented, No gross deficits  Assessment and plan:  Dyspnea Symptoms consistent with community acquired pneumonia Will treat with azithromycin X5 days Chest x-ray, if no lobar infiltrate is seen anxious symptoms are severe enough to consider this a typical pneumonia.    Orders Placed This Encounter  Procedures  . DG Chest 2 View    Standing Status: Future     Number of Occurrences:      Standing Expiration Date: 01/02/2016    Order Specific Question:  Reason for Exam (SYMPTOM  OR DIAGNOSIS REQUIRED)    Answer:  eval for CAP    Order Specific Question:  Preferred imaging location?    Answer:  [facility]    Meds ordered this encounter  Medications  . azithromycin (ZITHROMAX) 250 MG tablet    Sig: Take 2 tabs on  day 1 and then 1 tab daily until finished    Dispense:  6 tablet    Refill:  0

## 2014-11-01 NOTE — Assessment & Plan Note (Addendum)
Symptoms consistent with community acquired pneumonia Will treat with azithromycin X5 days Chest x-ray, if no lobar infiltrate is seen anxious symptoms are severe enough to consider this a typical pneumonia.

## 2014-11-02 ENCOUNTER — Telehealth: Payer: Self-pay | Admitting: Family Medicine

## 2014-11-02 NOTE — Telephone Encounter (Signed)
Left message for patient to call back  

## 2014-11-02 NOTE — Telephone Encounter (Signed)
CXR c/w CAP, continue azithro.   Will ask nursing to inform.   Laroy Apple, MD [facility] Resident, PGY-3 11/02/2014, 4:39 PM

## 2014-11-03 NOTE — Telephone Encounter (Signed)
Left another message for patient to call back 

## 2014-11-09 NOTE — Telephone Encounter (Signed)
Pt called back ans was informed. Tu Bayle, Salome Spotted

## 2015-02-24 ENCOUNTER — Telehealth: Payer: Self-pay | Admitting: Family Medicine

## 2015-02-24 NOTE — Telephone Encounter (Signed)
Christopher [name] is asking for a referral to a nose specialist to have removal of a lesion on nose

## 2015-02-25 NOTE — Telephone Encounter (Signed)
[phone] I called patient's cell phone and left message; I would like more information about the location/type of lesion in order to better describe for purposes of a referral.  JB

## 2015-05-09 ENCOUNTER — Ambulatory Visit (INDEPENDENT_AMBULATORY_CARE_PROVIDER_SITE_OTHER): Payer: BLUE CROSS/BLUE SHIELD | Admitting: Family Medicine

## 2015-05-09 ENCOUNTER — Encounter: Payer: Self-pay | Admitting: Family Medicine

## 2015-05-09 VITALS — BP 134/74 | HR 62 | Temp 98.2°F | Ht 74.0 in | Wt 218.0 lb

## 2015-05-09 DIAGNOSIS — C44301 Unspecified malignant neoplasm of skin of nose: Secondary | ICD-10-CM | POA: Diagnosis not present

## 2015-05-09 NOTE — Patient Instructions (Signed)
It was a pleasure to meet you today.   You have been referred to the [facility]. My nursing staff will call you to schedule an appointment.

## 2015-05-09 NOTE — Progress Notes (Signed)
Subjective:     Patient ID: Christopher [name], male   DOB: 06-06-1956, 59 y.o.   MRN: [id number] Written by: Dois Davenport, MS3 HPI Christopher [name] presents today for follow-up from a Comprehensive Preventive Exam at the [facility] in [place], [place] in February of 2016. At that appointment, he received cryotherapy to several lesions on his ear and arm and a shave biopsy of the tip of his nose. He was later told that the lesion on his nose was cancerous, but cannot recall the specific type. He requests referral to a Mohs surgeon. He has no history of skin cancer, but has received cryotherapy to several skin lesions in the psat.   Review of Systems See HPI.     Objective:   Physical Exam BP 134/74 mmHg  Pulse 62  Temp(Src) 98.2 F (36.8 C) (Oral)  Ht 6\' 2"  (1.88 m)  Wt 218 lb (98.884 kg)  BMI 27.98 kg/m2 Skin: slightly erythematous scar on tip of nose CV: RRR, no MRG, nl S1 and S2 Pulmonary: CTA bilaterally, no crackles or wheezes     Assessment:     Please see Problem List.     Plan:     Please see Problem List.     I personally saw and evaluated the patient. I have reviewed the medical student note and agree with the documentation. I personally completed the physical exam. Additions to the medical student note are made in blue.   Dossie Arbour MD

## 2015-05-09 NOTE — Assessment & Plan Note (Addendum)
Shave biopsy performed at the [facility] in [place], [place] determined skin cancer of nose. Patient unclear on type, but requests referral to Mohs surgeon.  - Placed referral to [facility] in [place], [place], [place]  I agree with the problem specific assessment and plan.  -referral to Dermatology made  Dossie Arbour MD

## 2015-05-10 ENCOUNTER — Encounter: Payer: Self-pay | Admitting: Family Medicine

## 2015-05-10 NOTE — Progress Notes (Addendum)
Reviewed [facility] Notes (see scanned copy) and updated Epic where appropriate. Only mention of a skin lesion was "history of basal cell carcinoma removed in 2012".

## 2015-06-15 ENCOUNTER — Ambulatory Visit (INDEPENDENT_AMBULATORY_CARE_PROVIDER_SITE_OTHER): Payer: BLUE CROSS/BLUE SHIELD | Admitting: Family Medicine

## 2015-06-15 VITALS — BP 136/74 | HR 84 | Temp 98.2°F | Ht 72.0 in | Wt 211.1 lb

## 2015-06-15 DIAGNOSIS — L03011 Cellulitis of right finger: Secondary | ICD-10-CM

## 2015-06-15 DIAGNOSIS — IMO0002 Reserved for concepts with insufficient information to code with codable children: Secondary | ICD-10-CM | POA: Insufficient documentation

## 2015-06-15 MED ORDER — CEPHALEXIN 500 MG PO CAPS: 500.0000 mg | ORAL_CAPSULE | Freq: Four times a day (QID) | ORAL | Status: DC

## 2015-06-15 NOTE — Progress Notes (Signed)
   Subjective:    Patient ID: Christopher [name], male    DOB: [birth year], 59 y.o.   MRN: [id number]  HPI 59 year old male presents for same day appointment with complaints of right thumb pain.  1) Right thumb pain  Patient reports that he developed right thumb pain yesterday.  Pain described as throbbing.  Pain mild to moderate in severity.  Pain is located just under the nailbed and laterally.  Patient reports that he has drained the area by sticking a needle under the nailbed. He reports that pus came out of the area.  With drainage he had some relief in his pain. However, this recurred again today.  He presents today for evaluation with continued complaints of right thumb pain.  No recent fevers or chills. He is otherwise doing well.  Review of Systems  Constitutional: Negative for fever and chills.  Gastrointestinal: Negative.       Objective:   Physical Exam Filed Vitals:   06/15/15 1000  BP: 136/74  Pulse: 84  Temp: 98.2 F (36.8 C)   Vital signs reviewed.  Exam: General: well appearing male in no acute distress. Extremities: Right thumb - lateral nailbed redness, warmth, and tenderness to palpation. No frank abscess/pus noted.    Assessment & Plan:  See problem list.

## 2015-06-15 NOTE — Patient Instructions (Signed)
It was nice to see you today.  Take antibiotic as prescribed.  Follow-up if you fail to improve or worsen.  Take care  Dr. Lacinda Axon

## 2015-06-15 NOTE — Assessment & Plan Note (Signed)
Patient with what appears to be a acute paronychia although no pus/drainage was noted (he has however noted this as he drained it himself).  Treating with Keflex. Follow-up as needed.

## 2015-11-24 ENCOUNTER — Encounter: Payer: Self-pay | Admitting: Family Medicine

## 2015-11-24 ENCOUNTER — Ambulatory Visit (INDEPENDENT_AMBULATORY_CARE_PROVIDER_SITE_OTHER): Payer: BLUE CROSS/BLUE SHIELD | Admitting: Family Medicine

## 2015-11-24 VITALS — BP 142/74 | HR 74 | Temp 98.0°F | Ht 72.0 in | Wt 216.6 lb

## 2015-11-24 DIAGNOSIS — J069 Acute upper respiratory infection, unspecified: Secondary | ICD-10-CM | POA: Diagnosis not present

## 2015-11-24 MED ORDER — AMOXICILLIN-POT CLAVULANATE 875-125 MG PO TABS: 1.0000 | ORAL_TABLET | Freq: Two times a day (BID) | ORAL | Status: DC

## 2015-11-24 NOTE — Patient Instructions (Signed)
Your symptoms are due to a viral illness. Antibiotics will not help improve your symptoms, but the following will help you feel better while your body fights the virus.   Stay hydrated - drink a lot of water   Nasal Saline Spray  Congestion:   Nose spray: Afrin (Phenylephrine). DO NOT USE MORE THAN 3 DAYS  Oral: Pseudoephedrine  Sneezing & Runny nose: Cetirizine (Zyrtec), Fexofenadine (Allegra), Loratadine (Claritin)  Pain/Sore throat: Tylenol, Ibuprofen  Cough: Dextromethorphan , (Guaifenesin, "Mucinex")  Wash your hands often to prevent spreading the virus  Don't take the antbiotic for another 3-4 days (start if you feel like you are not improving)

## 2015-11-24 NOTE — Progress Notes (Signed)
   Subjective:    Patient ID: Christopher [name], male    DOB: 1956/02/21, 59 y.o.   MRN: [id number]  HPI  CC: URI/cough/congestion  # URI:  Started 1 week ago, feels like it got worse in the last 2 days with going down into his chest  1.5 days ago lost his voice  Cough worse in the AM  Does feel short of breath today  No CP  Continues to have nasal congestion, sinus pressure and pain (doesn't say if worse on left or right)  Has tried claritin-D (takes daily for allergies), some nyquil last night  Got flu shot at outside clinic ROS: no fever, no tooth pain, no sore throat  Social Hx: never smoker  Review of Systems   See HPI for ROS.   Past medical history, surgical, family, and social history reviewed and updated in the EMR as appropriate. Objective:  BP 142/74 mmHg  Pulse 74  Temp(Src) 98 F (36.7 C) (Oral)  Ht 6' (1.829 m)  Wt 216 lb 9.6 oz (98.249 kg)  BMI 29.37 kg/m2 Vitals and nursing note reviewed  General: NAD HEENT: TMs pearly gray bilaterally, no effusion/erythema (posterior left TM with scar). Nares with clear rhinorrhea, inflammed turbinates. No major sinus tenderness. Dry mucous membranes with scalloping tongue. Throat is with mild erythema, no exudate. Neck: no lymphadenopathy CV: RRR, normal s1s2, no murmurs, rubs or gallop Resp: clear to auscultation bilaterally, normal effort. No w/r/c Skin: no rashes Neuro: alert and oriented Psych: normal mood & affect, normal thought content & speech   Assessment & Plan:  1. Acute upper respiratory infection Likely viral, recommended adequate hydration and OTC cough/cold. Counseled not to use claritin-D regularly, that it can cause rebound issues and should take claritin and decongestant separate, only adding decongestant when sick (recommended alternating with regular claritin to taper off decongestant). Augmentin rx sent with instructions not to start for 3-4 days if not improving or develops worsening sinus  pain. Already has flu shot. Follow up as needed.

## 2017-11-15 ENCOUNTER — Other Ambulatory Visit: Payer: Self-pay

## 2017-11-15 ENCOUNTER — Encounter: Payer: Self-pay | Admitting: Family Medicine

## 2017-11-15 ENCOUNTER — Ambulatory Visit (INDEPENDENT_AMBULATORY_CARE_PROVIDER_SITE_OTHER): Payer: Self-pay | Admitting: Family Medicine

## 2017-11-15 VITALS — BP 128/76 | HR 73 | Temp 98.1°F | Ht 72.0 in | Wt 224.8 lb

## 2017-11-15 DIAGNOSIS — J0100 Acute maxillary sinusitis, unspecified: Secondary | ICD-10-CM | POA: Insufficient documentation

## 2017-11-15 MED ORDER — FLUTICASONE PROPIONATE 50 MCG/ACT NA SUSP: 2.0000 | Freq: Every day | NASAL | 6 refills | Status: DC

## 2017-11-15 MED ORDER — AMOXICILLIN 875 MG PO TABS: 875.0000 mg | ORAL_TABLET | Freq: Two times a day (BID) | ORAL | 0 refills | Status: AC

## 2017-11-15 NOTE — Patient Instructions (Signed)
It was a pleasure to see you today! Thank you for choosing [facility] for your primary care. Christopher [name] was seen for sinusitis. Come back to the clinic if your symptoms worsen or do not improve over the next week, and go to the emergency room if you have any life threatening symptoms.  You were seen today for your sinus problems and we discussed how your respiration is doing well and you are not having a fever.   However you are having enough symptoms and for long enough of a time frame that we are prescribing an antibiotic and a nasal spray to help deal with the blockage and potential infection.  If we did any lab work today, and the results require attention, either me or my nurse will get in touch with you. If everything is normal, you will get a letter in mail and a message via . If you don't hear from [place] in two weeks, please give [place] a call. Otherwise, we look forward to seeing you again at your next visit. If you have any questions or concerns before then, please call the clinic at [phone].  Please bring all your medications to every doctors visit  Sign up for My Chart to have easy access to your labs results, and communication with your Primary care physician.    Please check-out at the front desk before leaving the clinic.    Best,  Dr. Sherene Sires FAMILY MEDICINE RESIDENT - PGY1 11/15/2017 11:23 AM

## 2017-11-15 NOTE — Assessment & Plan Note (Signed)
More than 2wks, now with sinus pressure and cough, subjective fever.Marland KitchenMarland KitchenMarland KitchenMarland Kitchenprescription for flonase and amoxicillin

## 2017-11-15 NOTE — Progress Notes (Signed)
    Subjective:  Christopher [name] is a 61 y.o. male who presents to the [facility] today with a chief complaint of sinus problems.   HPI: He has been having a runny nose consistent with his seasonal and dust/pollen allergies for the past few weeks.   It has never quite resolved and has gotten better and then worse.   He describes sinus pressure particularly in R maxilla, yellow drainage without blood, changes in hearing but no ear pain, cough developing in the last few days with yellow sputum, and 1 episode of nausea w/o vomiting during a particularly bad coughhing spell.   He describes only 1 day in which he felt febrile but did not objectively check his bp.  No SOB, chest pain, urinary symptoms, diarrhea.  His only sick contact is a 87yr old daughter with mrsa abscess on leg.  He has taken only claritin for this.     Objective:  Physical Exam: BP 128/76   Pulse 73   Temp 98.1 F (36.7 C) (Oral)   Ht 6' (1.829 m)   Wt 224 lb 12.8 oz (102 kg)   SpO2 95%   BMI 30.49 kg/m   Gen: NAD, sitting comfortably CV: RRR with no murmurs appreciated Pulm: NWOB, CTAB with no crackles, wheezes, or rhonchi, no coughing during exam Ears were normal with no indication of otitis media/externa, nose w/ clear drainage and no blood R maxilla tender to palpation but no deformity or visual concerns GI: Normal bowel sounds present. Soft, Nontender, Nondistended. Skin: warm, dry Neuro: grossly normal, moves all extremities Psych: Normal affect and thought content  No results found for this or any previous visit (from the past 72 hour(s)).   Assessment/Plan:  Acute non-recurrent maxillary sinusitis More than 2wks, now with sinus pressure and cough, subjective fever.Marland KitchenMarland KitchenMarland KitchenMarland Kitchenprescription for flonase and amoxicillin   Sherene Sires, [place] - PGY1 11/15/2017 11:46 AM

## 2018-09-04 ENCOUNTER — Emergency Department (HOSPITAL_COMMUNITY)
Admission: EM | Admit: 2018-09-04 | Discharge: 2018-09-05 | Disposition: A | Discharge status: Home / Self Care | Payer: 59 | Attending: Emergency Medicine | Admitting: Emergency Medicine

## 2018-09-04 ENCOUNTER — Emergency Department (HOSPITAL_COMMUNITY): Payer: 59

## 2018-09-04 ENCOUNTER — Encounter (HOSPITAL_COMMUNITY): Payer: Self-pay | Admitting: Emergency Medicine

## 2018-09-04 ENCOUNTER — Other Ambulatory Visit: Payer: Self-pay

## 2018-09-04 DIAGNOSIS — R509 Fever, unspecified: Secondary | ICD-10-CM

## 2018-09-04 DIAGNOSIS — R3121 Asymptomatic microscopic hematuria: Secondary | ICD-10-CM | POA: Insufficient documentation

## 2018-09-04 DIAGNOSIS — R6883 Chills (without fever): Secondary | ICD-10-CM

## 2018-09-04 DIAGNOSIS — R7989 Other specified abnormal findings of blood chemistry: Secondary | ICD-10-CM | POA: Insufficient documentation

## 2018-09-04 DIAGNOSIS — Z9049 Acquired absence of other specified parts of digestive tract: Secondary | ICD-10-CM | POA: Diagnosis not present

## 2018-09-04 DIAGNOSIS — Z79899 Other long term (current) drug therapy: Secondary | ICD-10-CM | POA: Insufficient documentation

## 2018-09-04 DIAGNOSIS — Z85828 Personal history of other malignant neoplasm of skin: Secondary | ICD-10-CM | POA: Diagnosis not present

## 2018-09-04 LAB — URINALYSIS, ROUTINE W REFLEX MICROSCOPIC
BACTERIA UA: NONE SEEN
Bilirubin Urine: NEGATIVE
GLUCOSE, UA: NEGATIVE mg/dL
KETONES UR: NEGATIVE mg/dL
LEUKOCYTES UA: NEGATIVE
Nitrite: NEGATIVE
Protein, ur: NEGATIVE mg/dL
SPECIFIC GRAVITY, URINE: 1.018 (ref 1.005–1.030)
pH: 7 (ref 5.0–8.0)

## 2018-09-04 LAB — I-STAT CG4 LACTIC ACID, ED: Lactic Acid, Venous: 2.15 mmol/L (ref 0.5–1.9)

## 2018-09-04 MED ORDER — ACETAMINOPHEN 325 MG PO TABS
650.0000 mg | ORAL_TABLET | Freq: Once | ORAL | Status: AC | PRN
  Administered 2018-09-04: 650 mg via ORAL
  Filled 2018-09-04: qty 2

## 2018-09-04 NOTE — ED Triage Notes (Signed)
Pt reports headache today. Pt took ibuprofen at home with relief. Pt reports chills starting this afternoon. Pt febrile upon arrival of 102.5 oral. Pt denies cough, abdominal pain, nausea. Pt denies being exposed to anyone sick.

## 2018-09-05 LAB — COMPREHENSIVE METABOLIC PANEL
ALT: 29 U/L (ref 0–44)
ANION GAP: 11 (ref 5–15)
AST: 26 U/L (ref 15–41)
Albumin: 3.9 g/dL (ref 3.5–5.0)
Alkaline Phosphatase: 61 U/L (ref 38–126)
BUN: 17 mg/dL (ref 8–23)
CHLORIDE: 100 mmol/L (ref 98–111)
CO2: 28 mmol/L (ref 22–32)
Calcium: 9.4 mg/dL (ref 8.9–10.3)
Creatinine, Ser: 1.29 mg/dL — ABNORMAL HIGH (ref 0.61–1.24)
GFR calc Af Amer: >60 mL/min (ref >60)
GFR calc non Af Amer: 58 mL/min — ABNORMAL LOW (ref >60)
GLUCOSE: 120 mg/dL — AB (ref 70–99)
POTASSIUM: 4.1 mmol/L (ref 3.5–5.1)
SODIUM: 139 mmol/L (ref 135–145)
Total Bilirubin: 1.2 mg/dL (ref 0.3–1.2)
Total Protein: 7.4 g/dL (ref 6.5–8.1)

## 2018-09-05 LAB — CBC WITH DIFFERENTIAL/PLATELET
Abs Immature Granulocytes: 0 10*3/uL (ref 0.0–0.1)
BASOS ABS: 0.1 10*3/uL (ref 0.0–0.1)
Basophils Relative: 1 %
EOS PCT: 2 %
Eosinophils Absolute: 0.3 10*3/uL (ref 0.0–0.7)
HEMATOCRIT: 46.8 % (ref 39.0–52.0)
HEMOGLOBIN: 15.7 g/dL (ref 13.0–17.0)
Immature Granulocytes: 0 %
LYMPHS ABS: 0.7 10*3/uL (ref 0.7–4.0)
LYMPHS PCT: 7 %
MCH: 29 pg (ref 26.0–34.0)
MCHC: 33.5 g/dL (ref 30.0–36.0)
MCV: 86.5 fL (ref 78.0–100.0)
MONO ABS: 0.8 10*3/uL (ref 0.1–1.0)
Monocytes Relative: 8 %
NEUTROS ABS: 8.6 10*3/uL — AB (ref 1.7–7.7)
Neutrophils Relative %: 82 %
Platelets: 164 10*3/uL (ref 150–400)
RBC: 5.41 MIL/uL (ref 4.22–5.81)
RDW: 13.2 % (ref 11.5–15.5)
WBC: 10.5 10*3/uL (ref 4.0–10.5)

## 2018-09-05 LAB — INFLUENZA PANEL BY PCR (TYPE A & B)
INFLAPCR: NEGATIVE
Influenza B By PCR: NEGATIVE

## 2018-09-05 LAB — I-STAT CG4 LACTIC ACID, ED: LACTIC ACID, VENOUS: 1.24 mmol/L (ref 0.5–1.9)

## 2018-09-05 MED ORDER — SODIUM CHLORIDE 0.9 % IV BOLUS
1000.0000 mL | Freq: Once | INTRAVENOUS | Status: AC
  Administered 2018-09-05: 1000 mL via INTRAVENOUS

## 2018-09-05 NOTE — Discharge Instructions (Addendum)
1. Medications: usual home medications 2. Treatment: rest, drink plenty of fluids,  3. Follow Up: Please followup with your primary doctor in 3 days for discussion of your diagnoses, repeat lab and urinalysis and further evaluation after today's visit;  Please return to the ER for worsening fever, vomiting, vision changes, neck stiffness or any other concerns

## 2018-09-05 NOTE — ED Notes (Signed)
Pt ambulated around nurses desk at pod C. Pt denies any sob or pain. Pt also given sprite as requested per pt. Unable to get temp at this time due to pt drinking cold drink

## 2018-09-05 NOTE — ED Provider Notes (Signed)
[facility] EMERGENCY DEPARTMENT Provider Note   CSN: [id number] Arrival date & time: 09/04/18  2242     History   Chief Complaint Chief Complaint  Patient presents with  . Chills    HPI Christopher [name] is a 62 y.o. male with a hx of cholecystectomy, high cholesterol, presents to the Emergency Department complaining of gradual, persistent, progressively worsening fever and rigors onset yesterday evening.  Pt reports earlier in the day he had generalized fatigue and mild, generalized throbbing headache.  Pt denies neck pain, neck stiffness, vision changes.  Pt reports his headache resolved, but he awoke with chills and rigors, prompting his visit to the ED.  Pt denies known sick contacts, international travel, tick bites.  No treatments PTA.  No aggravating or alleviating factors.    The history is provided by the patient and medical records. No language interpreter was used.    History reviewed. No pertinent past medical history.  Patient Active Problem List   Diagnosis Date Noted  . Acute non-recurrent maxillary sinusitis 11/15/2017  . Paronychia 06/15/2015  . Skin cancer of nose 05/09/2015  . HYPERCHOLESTEROLEMIA 12/22/2010  . MICROSCOPIC HEMATURIA 06/14/2009  . DYSPNEA ON EXERTION 03/24/2009  . OTHER DISEASES OF LUNG NOT ELSEWHERE CLASSIFIED 03/04/2008  . COLON POLYP 02/13/2007    Past Surgical History:  Procedure Laterality Date  . CHOLECYSTECTOMY, LAPAROSCOPIC  2005  . LASIK  2004        Home Medications    Prior to Admission medications   Medication Sig Start Date End Date Taking? Authorizing Provider  cetirizine (ZYRTEC) 10 MG tablet Take 10 mg by mouth daily.   Yes [provider]  fluticasone (FLONASE) 50 MCG/ACT nasal spray Place 2 sprays into both nostrils daily. Patient not taking: Reported on 09/05/2018 11/15/17   Sherene Sires, DO  oxymetazoline (AFRIN) 0.05 % nasal spray Place 2 sprays into the nose 2 (two) times  daily. Patient not taking: Reported on 09/05/2018 10/02/11   Candelaria Celeste, MD    Family History History reviewed. No pertinent family history.  Social History Social History   Tobacco Use  . Smoking status: Never Smoker  . Smokeless tobacco: Never Used  Substance Use Topics  . Alcohol use: Not on file  . Drug use: Not on file     Allergies   Patient has no known allergies.   Review of Systems Review of Systems  Constitutional: Positive for chills, fatigue and fever. Negative for appetite change, diaphoresis and unexpected weight change.  HENT: Negative for mouth sores.   Eyes: Negative for visual disturbance.  Respiratory: Negative for cough, chest tightness, shortness of breath and wheezing.   Cardiovascular: Negative for chest pain.  Gastrointestinal: Negative for abdominal pain, constipation, diarrhea, nausea and vomiting.  Endocrine: Negative for polydipsia, polyphagia and polyuria.  Genitourinary: Negative for dysuria, frequency, hematuria and urgency.  Musculoskeletal: Negative for back pain and neck stiffness.  Skin: Negative for rash.  Allergic/Immunologic: Negative for immunocompromised state.  Neurological: Negative for syncope, light-headedness and headaches ( resolved).  Hematological: Does not bruise/bleed easily.  Psychiatric/Behavioral: Negative for sleep disturbance. The patient is not nervous/anxious.      Physical Exam Updated Vital Signs BP 119/80   Pulse 94   Temp 100 F (37.8 C) (Oral)   Resp 17   SpO2 95%   Physical Exam  Constitutional: He appears well-developed and well-nourished. No distress.  Awake, alert, nontoxic appearance  HENT:  Head: Normocephalic and atraumatic.  Right Ear: Tympanic membrane,  external ear and ear canal normal.  Left Ear: Tympanic membrane, external ear and ear canal normal.  Nose: Nose normal. Right sinus exhibits no maxillary sinus tenderness and no frontal sinus tenderness. Left sinus exhibits no maxillary  sinus tenderness and no frontal sinus tenderness.  Mouth/Throat: Oropharynx is clear and moist. Mucous membranes are not dry. No oropharyngeal exudate, posterior oropharyngeal edema or posterior oropharyngeal erythema.  Eyes: Conjunctivae are normal. No scleral icterus.  Neck: Normal range of motion. Neck supple. No spinous process tenderness and no muscular tenderness present. No neck rigidity. Normal range of motion present.  Cardiovascular: Normal rate, regular rhythm and intact distal pulses.  Pulmonary/Chest: Effort normal and breath sounds normal. No respiratory distress. He has no wheezes.  Equal chest expansion Clear and equal breath sounds  Abdominal: Soft. Bowel sounds are normal. He exhibits no mass. There is no tenderness. There is no rebound and no guarding.  Musculoskeletal: Normal range of motion. He exhibits no edema.  Lymphadenopathy:    He has no cervical adenopathy.  Neurological: He is alert.  Speech is clear and goal oriented Moves extremities without ataxia  Skin: Skin is warm and dry. He is not diaphoretic.  No Petechiae or purpura No rash  Psychiatric: He has a normal mood and affect.  Nursing note and vitals reviewed.    ED Treatments / Results  Labs (all labs ordered are listed, but only abnormal results are displayed) Labs Reviewed  COMPREHENSIVE METABOLIC PANEL - Abnormal; Notable for the following components:      Result Value   Glucose, Bld 120 (*)    Creatinine, Ser 1.29 (*)    GFR calc non Af Amer 58 (*)    All other components within normal limits  CBC WITH DIFFERENTIAL/PLATELET - Abnormal; Notable for the following components:   Neutro Abs 8.6 (*)    All other components within normal limits  URINALYSIS, ROUTINE W REFLEX MICROSCOPIC - Abnormal; Notable for the following components:   Hgb urine dipstick MODERATE (*)    All other components within normal limits  I-STAT CG4 LACTIC ACID, ED - Abnormal; Notable for the following components:    Lactic Acid, Venous 2.15 (*)    All other components within normal limits  INFLUENZA PANEL BY PCR (TYPE A & B)  I-STAT CG4 LACTIC ACID, ED     Radiology Dg Chest 2 View  Result Date: 09/04/2018 CLINICAL DATA:  Fever and chills for 1 day. EXAM: CHEST - 2 VIEW COMPARISON:  11/01/2014 FINDINGS: The heart size and mediastinal contours are within normal limits. Both lungs are clear. The visualized skeletal structures are unremarkable. IMPRESSION: No active cardiopulmonary disease. Electronically Signed   By: Lucienne Capers M.D.   On: 09/04/2018 23:34    Procedures Procedures (including critical care time)  Medications Ordered in ED Medications  acetaminophen (TYLENOL) tablet 650 mg (650 mg Oral Given 09/04/18 2254)  sodium chloride 0.9 % bolus 1,000 mL (1,000 mLs Intravenous New Bag/Given 09/05/18 0203)     Initial Impression / Assessment and Plan / ED Course  I have reviewed the triage vital signs and the nursing notes.  Pertinent labs & imaging results that were available during my care of the patient were reviewed by me and considered in my medical decision making (see chart for details).  Clinical Course as of Sep 06 431  Fri Sep 05, 2018  0215 Febrile on arrival  Temp(!): 102.5 F (39.2 C) [HM]  0417 Influenza negative  Influenza A By PCR: NEGATIVE [  HM]  0418 Repeat lactic acid improved  Lactic Acid, Venous: 1.24 [HM]  0418 Noted elevated creatinine.  Will need f/u and repeat labwork from PCP  Creatinine(!): 1.29 [HM]  0418 No leukocytosis  WBC: 10.5 [HM]  0418 No evidence of PNA, PTX or pulmonary edema.  I personally evaluated these images.    DG Chest 2 View [HM]    Clinical Course User Index [HM] Ariellah Faust, Jarrett Soho, PA-C    Pt presents with fatigue, chills, rigors and fever.  Pt febrile on arrival in the ED.  Labs reassuring.  Elevated serum creatinine from baseline with last value 0.85 seven years ago.  Pt without tachycardia, hypoxia or hypotension.  Initial  lactic acid elevated, but improved.  Pt given fluids.  Negative influenza panel.  CXR without evidence of PNA.  No urinary symptoms and no evidence of UTI on UA.  Asymptomatic, microscopic hematuria noted.  Patient denies all urinary symptoms.  Lungs are clear and equal.  No evidence of otitis media or bacterial pharyngitis on exam.  No rhinorrhea, cough.  Unknown etiology of patient's fever at this time.  Suspect this is viral in nature.  No nuchal rigidity or signs of meningitis.  No petechiae or purpura.  Discussed reasons to return immediately to the emergency department including development of neck pain, changes in vision, return of symptoms or other concerns.  Additionally, patient noted to have elevated serum creatinine and hematuria.  Discussed need for close follow-up with primary care about these things.  Patient states understanding and is in agreement with the plan.  BP 125/77   Pulse 81   Temp 100 F (37.8 C) (Oral)   Resp 19   SpO2 92%    Final Clinical Impressions(s) / ED Diagnoses   Final diagnoses:  Chills  Fever, unspecified fever cause  Elevated serum creatinine  Asymptomatic microscopic hematuria    ED Discharge Orders    None       Agapito Games 09/05/18 [place], April, MD 09/05/18 [id number]

## 2018-09-05 NOTE — ED Notes (Signed)
Pt discharged from ED; instructions provided; Pt encouraged to return to ED if symptoms worsen and to f/u with PCP; Pt verbalized understanding of all instructions 

## 2018-10-14 ENCOUNTER — Ambulatory Visit (INDEPENDENT_AMBULATORY_CARE_PROVIDER_SITE_OTHER): Payer: 59 | Admitting: Family Medicine

## 2018-10-14 ENCOUNTER — Other Ambulatory Visit: Payer: Self-pay

## 2018-10-14 VITALS — BP 140/82 | HR 70 | Temp 98.4°F | Wt 228.0 lb

## 2018-10-14 DIAGNOSIS — J069 Acute upper respiratory infection, unspecified: Secondary | ICD-10-CM | POA: Diagnosis not present

## 2018-10-14 MED ORDER — FLUTICASONE PROPIONATE 50 MCG/ACT NA SUSP: 2.0000 | Freq: Every day | NASAL | 6 refills | Status: DC

## 2018-10-14 NOTE — Patient Instructions (Signed)

## 2018-10-14 NOTE — Assessment & Plan Note (Addendum)
Patient presents with classic symptoms of URI with cough, rhinorrhea, congestion for the past 5 days.  Patient denies any fever chills shortness of breath at home.  Oxygen saturation today in clinic is 99%.  Lung exam was clear is moving air bilaterally in both upper and lower lung fields without any wheezing or rhonchi.  Patient is congested and has some rhinorrhea.  Symptoms likely secondary to viral URI.  Recommend continuing NyQuil as needed.  Will prescribe Flonase and Mucinex.  Instructed patient to increase hydration as well as rest.  Patient is that symptoms could last up to 10 days.  If patient develops any fevers, chills, increased shortness of breath he will return to clinic for further evaluation.  Patient verbalized understanding and is in agreement with our plan.

## 2018-10-14 NOTE — Progress Notes (Signed)
   Subjective:    Patient ID: Christopher [name], male    DOB: Nov 22, 1956, 62 y.o.   MRN: [id number]   CC: Cough and concern for pneumonia  HPI: Patient is a 62 year old male who presents today complaining of cough, congestion, rhinorrhea for the past 4 days.  Patient has recorded temperature in [place] since then has been experiencing cold symptoms.  Patient reports that he was in bed all weekend with cough, congestion and runny nose.  He has been taking NyQuil and Zyrtec with minimal improvement in his symptoms.  Since yesterday he has noticed change in his voice.  Patient denies any shortness of breath fever or chills.  Patient is concerned because in the past has had similar symptoms and was eventually diagnosed with walking pneumonia.  Patient is here today for further evaluation.  Denies any chest pain, nausea, vomiting, dizziness, headache.  Smoking status reviewed   ROS: all other systems were reviewed and are negative other than in the HPI   History reviewed. No pertinent past medical history.  Past Surgical History:  Procedure Laterality Date  . CHOLECYSTECTOMY, LAPAROSCOPIC  2005  . LASIK  2004    Past medical history, surgical, family, and social history reviewed and updated in the EMR as appropriate.  Objective:  BP 140/82   Pulse 70   Temp 98.4 F (36.9 C) (Oral)   Wt 228 lb (103.4 kg)   SpO2 99%   BMI 30.92 kg/m   Vitals and nursing note reviewed  General: NAD, pleasant, able to participate in exam Cardiac: RRR, normal heart sounds, no murmurs. 2+ radial and PT pulses bilaterally Respiratory: CTAB, normal effort, No wheezes, rales or rhonchi Abdomen: soft, nontender, nondistended, no hepatic or splenomegaly, +BS Extremities: no edema or cyanosis. WWP. Skin: warm and dry, no rashes noted Neuro: alert and oriented x4, no focal deficits Psych: Normal affect and mood   Assessment & Plan:   Acute upper respiratory infection Patient presents with classic symptoms  of URI with cough, rhinorrhea, congestion for the past 5 days.  Patient denies any fever chills shortness of breath at home.  Oxygen saturation today in clinic is 99%.  Lung exam was clear is moving air bilaterally in both upper and lower lung fields without any wheezing or rhonchi.  Patient is congested and has some rhinorrhea.  Symptoms likely secondary to viral URI.  Recommend continuing NyQuil as needed.  Will prescribe Flonase and Mucinex.  Instructed patient to increase hydration as well as rest.  Patient is that symptoms could last up to 10 days.  If patient develops any fevers, chills, increased shortness of breath he will return to clinic for further evaluation.  Patient verbalized understanding and is in agreement with our plan.   Marjie Skiff, MD [place] PGY-3

## 2020-02-22 DIAGNOSIS — Z23 Encounter for immunization: Secondary | ICD-10-CM | POA: Diagnosis not present

## 2021-03-22 DIAGNOSIS — Z01818 Encounter for other preprocedural examination: Secondary | ICD-10-CM | POA: Diagnosis not present

## 2021-10-09 ENCOUNTER — Encounter: Payer: Self-pay | Admitting: Student

## 2021-10-09 ENCOUNTER — Other Ambulatory Visit: Payer: Self-pay

## 2021-10-09 ENCOUNTER — Ambulatory Visit (INDEPENDENT_AMBULATORY_CARE_PROVIDER_SITE_OTHER): Payer: BC Managed Care – PPO | Admitting: Student

## 2021-10-09 DIAGNOSIS — Z23 Encounter for immunization: Secondary | ICD-10-CM | POA: Diagnosis not present

## 2021-10-09 DIAGNOSIS — Z0000 Encounter for general adult medical examination without abnormal findings: Secondary | ICD-10-CM | POA: Diagnosis not present

## 2021-10-09 MED ORDER — ZOSTER VAC RECOMB ADJUVANTED 50 MCG/0.5ML IM SUSR: 0.5000 mL | Freq: Once | INTRAMUSCULAR | 0 refills | Status: AC

## 2021-10-09 NOTE — Progress Notes (Signed)
    SUBJECTIVE:   Chief compliant/HPI: annual examination  Christopher [name] is a 65 y.o. who presents today for an annual exam. No complaints today except for Covid weight gain.   History tabs reviewed and updated.   Review of systems negative for vision changes, headaches, chest pain, shortness of breath, abdominal pain, constipation, diarrhea, hematuria, hematochezia, swelling, muscle/joint pain   OBJECTIVE:   BP 130/88   Pulse 64   Ht 6' (1.829 m)   Wt 230 lb (104.3 kg)   SpO2 98%   BMI 31.19 kg/m   General: Well appearing, NAD, awake, alert, responsive to questions Head: Normocephalic atraumatic CV: Regular rate and rhythm no murmurs rubs or gallops Respiratory: Clear to ausculation bilaterally, no wheezes rales or crackles Abdomen: Soft, non-tender, non-distended, normoactive bowel sounds  Extremities: Moves upper and lower extremities freely, no edema in LE Neuro: No focal deficits Skin: No rashes or lesions visualized   ASSESSMENT/PLAN:   Annual Examination  See AVS for age appropriate recommendations.  PHQ score 0, reviewed and discussed.  Blood pressure value is 130/88 goal, discussed.  Discussed BMI and weight gain during COVID, patient currently exercising with tennis and walking 120 minutes a week.  Discussed nutrition and increasing vegetables to half of plate.  Considered the following screening exams based upon USPSTF recommendations: Diabetes screening: CMP Screening for elevated cholesterol: ordered HIV testing:  negative Hepatitis C:  negative Syphilis if at high risk: negative Colorectal cancer screening:  Patient says he received normal colonoscopy in 2019, will reach out to [facility] to obtain records Lung cancer screening:  never smoker  See documentation below regarding discussion and indication.   Follow up in 1 year or sooner if indicated.  Prevnar and Tdap administered today. Patient had received Covid booster and flu vaccine a couple of weeks ago.  Sent in [place], MD [place]

## 2021-10-09 NOTE — Patient Instructions (Addendum)
It was great to see you! Thank you for allowing me to participate in your care!   I recommend that you always bring your medications to each appointment as this makes it easy to ensure we are on the correct medications and helps [place] not miss when refills are needed.  Our plans for today:  - We will get you the prevnar vaccine and Tdap today - I will send in Shingrex vaccine to you  We are checking some labs today, I will call you if they are abnormal will send you a MyChart message or a letter if they are normal.  If you do not hear about your labs in the next 2 weeks please let us know.  Take care and seek immediate care sooner if you develop any concerns. Please remember to show up 15 minutes before your scheduled appointment time!  Gerrit Heck, MD [place]

## 2021-10-10 LAB — COMPREHENSIVE METABOLIC PANEL
ALT: 30 IU/L (ref 0–44)
AST: 32 IU/L (ref 0–40)
Albumin/Globulin Ratio: 1.4 (ref 1.2–2.2)
Albumin: 4.4 g/dL (ref 3.8–4.8)
Alkaline Phosphatase: 77 IU/L (ref 44–121)
BUN/Creatinine Ratio: 23 (ref 10–24)
BUN: 21 mg/dL (ref 8–27)
Bilirubin Total: 0.6 mg/dL (ref 0.0–1.2)
CO2: 21 mmol/L (ref 20–29)
Calcium: 9.2 mg/dL (ref 8.6–10.2)
Chloride: 99 mmol/L (ref 96–106)
Creatinine, Ser: 0.91 mg/dL (ref 0.76–1.27)
Globulin, Total: 3.1 g/dL (ref 1.5–4.5)
Glucose: 119 mg/dL — ABNORMAL HIGH (ref 70–99)
Potassium: 4.5 mmol/L (ref 3.5–5.2)
Sodium: 138 mmol/L (ref 134–144)
Total Protein: 7.5 g/dL (ref 6.0–8.5)
eGFR: 94 mL/min/{1.73_m2} (ref >59)

## 2021-10-10 LAB — LIPID PANEL
Chol/HDL Ratio: 4.7 ratio (ref 0.0–5.0)
Cholesterol, Total: 230 mg/dL — ABNORMAL HIGH (ref 100–199)
HDL: 49 mg/dL (ref >39)
LDL Chol Calc (NIH): 158 mg/dL — ABNORMAL HIGH (ref 0–99)
Triglycerides: 127 mg/dL (ref 0–149)
VLDL Cholesterol Cal: 23 mg/dL (ref 5–40)

## 2021-10-11 ENCOUNTER — Telehealth: Payer: Self-pay | Admitting: Student

## 2021-10-11 NOTE — Telephone Encounter (Signed)
Called patient and left VM with clinic number. Plan to discuss Cholesterol/ASCVD risk of 14.5% and whether patient would be interested in statin medication to prevent CV events

## 2021-10-16 ENCOUNTER — Encounter: Payer: Self-pay | Admitting: Student

## 2022-05-02 DIAGNOSIS — L814 Other melanin hyperpigmentation: Secondary | ICD-10-CM | POA: Diagnosis not present

## 2022-05-02 DIAGNOSIS — L821 Other seborrheic keratosis: Secondary | ICD-10-CM | POA: Diagnosis not present

## 2022-05-02 DIAGNOSIS — L570 Actinic keratosis: Secondary | ICD-10-CM | POA: Diagnosis not present

## 2022-05-21 NOTE — Patient Instructions (Signed)
Thank you for coming to see me today. It was a pleasure. Today we talked about:   ***  Recommend Shingles vaccine.  This is a 2 dose series and can be given at your local pharmacy.  Please talk to your pharmacist about this.   Please follow-up with PCP as needed  If you have any questions or concerns, please do not hesitate to call the office at [phone].  Best,   Carollee Leitz, MD

## 2022-05-22 ENCOUNTER — Ambulatory Visit
Admission: RE | Admit: 2022-05-22 | Discharge: 2022-05-22 | Disposition: A | Discharge status: Home / Self Care | Payer: BC Managed Care – PPO | Source: Ambulatory Visit | Attending: Family Medicine | Admitting: Family Medicine

## 2022-05-22 ENCOUNTER — Ambulatory Visit (INDEPENDENT_AMBULATORY_CARE_PROVIDER_SITE_OTHER): Payer: BC Managed Care – PPO | Admitting: Family Medicine

## 2022-05-22 ENCOUNTER — Encounter: Payer: Self-pay | Admitting: Family Medicine

## 2022-05-22 VITALS — BP 137/70 | HR 99 | Temp 98.6°F | Ht 72.0 in | Wt 230.2 lb

## 2022-05-22 DIAGNOSIS — J22 Unspecified acute lower respiratory infection: Secondary | ICD-10-CM

## 2022-05-22 DIAGNOSIS — J189 Pneumonia, unspecified organism: Secondary | ICD-10-CM

## 2022-05-22 DIAGNOSIS — R059 Cough, unspecified: Secondary | ICD-10-CM | POA: Diagnosis not present

## 2022-05-22 MED ORDER — DOXYCYCLINE HYCLATE 100 MG PO TABS: 100.0000 mg | ORAL_TABLET | Freq: Two times a day (BID) | ORAL | 0 refills | Status: AC

## 2022-05-22 NOTE — Progress Notes (Signed)
    SUBJECTIVE:   CHIEF COMPLAINT / HPI: Cough  Patient reports had a cold with runny nose 5 days ago.  2 days ago started to have some chest discomfort and cough producing yellow phlegm.  Has had some shortness of breath with difficulty breathing.  Reports intermittent fevers of 101, last fever yesterda.  Denies any nausea vomiting, diarrhea or constipation.  Reports has been hydrating well.  Took COVID test 4 days ago which was negative.  Reports that his 66 year old and 66 year old also had cold with similar symptoms.  Reports that he has had similar symptoms in the past that resulted in a walking pneumonia and pleurisy.  PERTINENT  PMH / PSH:  None  OBJECTIVE:   BP 137/70   Pulse 99   Temp 98.6 F (37 C) (Oral)   Ht 6' (1.829 m)   Wt 230 lb 4 oz (104.4 kg)   SpO2 94%   BMI 31.23 kg/m    General: Alert, no acute distress Cardio: Normal S1 and S2, RRR, no r/m/g Pulm: Diminished at bases, no rhonchi, wheezing appreciated, normal work of breathing Extremities: No peripheral edema.   ASSESSMENT/PLAN:   LRTI (lower respiratory tract infection) Given history of increased sputum production, shortness of breath, intermittent fevers will treat for CAP. -Chest x-ray -Doxycycline 100 mg twice daily x5 days. -Increase hydration -Patient is planning to go to [facility], strict precautions if worsening symptoms to go to urgent care or ED while on vacation. -Follow-up with PCP if symptoms worsen or no improvement.     Carollee Leitz, MD [place]

## 2022-05-23 ENCOUNTER — Telehealth: Payer: Self-pay

## 2022-05-23 NOTE — Telephone Encounter (Signed)
Patient calls nurse line checking the status of chest xray.  Will forward to provider who saw patient.

## 2022-05-27 ENCOUNTER — Encounter: Payer: Self-pay | Admitting: Family Medicine

## 2022-05-27 DIAGNOSIS — J22 Unspecified acute lower respiratory infection: Secondary | ICD-10-CM | POA: Insufficient documentation

## 2022-05-27 NOTE — Assessment & Plan Note (Addendum)
Given history of increased sputum production, shortness of breath, intermittent fevers will treat for CAP. -Chest x-ray -Doxycycline 100 mg twice daily x5 days. -Increase hydration -Patient is planning to go to [facility], strict precautions if worsening symptoms to go to urgent care or ED while on vacation. -Follow-up with PCP if symptoms worsen or no improvement.

## 2022-05-27 NOTE — Telephone Encounter (Signed)
Called patient to discuss results of chest xray.  No answer.  LVM that will attempt again tomorrow.  Carollee Leitz, MD [facility]

## 2022-07-02 DIAGNOSIS — H906 Mixed conductive and sensorineural hearing loss, bilateral: Secondary | ICD-10-CM | POA: Diagnosis not present

## 2022-08-10 DIAGNOSIS — K649 Unspecified hemorrhoids: Secondary | ICD-10-CM | POA: Diagnosis not present

## 2022-08-10 DIAGNOSIS — D124 Benign neoplasm of descending colon: Secondary | ICD-10-CM | POA: Diagnosis not present

## 2022-08-10 DIAGNOSIS — K635 Polyp of colon: Secondary | ICD-10-CM | POA: Diagnosis not present

## 2022-08-10 DIAGNOSIS — Z86010 Personal history of colonic polyps: Secondary | ICD-10-CM | POA: Diagnosis not present

## 2022-08-10 DIAGNOSIS — D128 Benign neoplasm of rectum: Secondary | ICD-10-CM | POA: Diagnosis not present

## 2022-11-01 ENCOUNTER — Telehealth: Payer: Self-pay | Admitting: Student

## 2022-11-01 NOTE — Telephone Encounter (Signed)
[place] dropped off physician statement form to be completed. Last DOS was 05/22/22. Placed in [employer].

## 2022-11-01 NOTE — Telephone Encounter (Signed)
Clinical info completed on Physician Statement form.  Placed form in PCP's box for completion.    When form is completed, please route note to "RN Team" and place in wall pocket in front office.   Salvatore Marvel, CMA

## 2022-11-08 NOTE — Telephone Encounter (Signed)
Called patient x3 to discuss the amount of time he was out due to illness and whether this was due to his presumed CAP for his paperwork. No answer. Please ask patient if returns call.

## 2022-11-13 NOTE — Telephone Encounter (Signed)
Patient returns call to nurse line. Patient reports sickness from 6/6-6/9. He was able to join family on trip on 6/10. Patient states that this was related to pneumonia illness.   Will forward to PCP.   Talbot Grumbling, RN

## 2022-11-16 NOTE — Telephone Encounter (Signed)
Dr. Owens Shark completed form for patient.  Copy placed in scan and original given to patient.  Esaias Cleavenger,CMA

## 2022-11-16 NOTE — Telephone Encounter (Signed)
Patient is calling to check on the status of form being completed. He said it is due today 11/16/22. He would like for someone to call him when it is ready to be picked up at the front desk.   The best call back is [phone]

## 2022-11-16 NOTE — Telephone Encounter (Signed)
Routed message to PCP. Fin Hupp, CMA  

## 2022-11-16 NOTE — Telephone Encounter (Signed)
Will forward to MD.  Patient came into office today to check status.  Christopher [name],CMA

## 2023-01-16 IMAGING — CR DG CHEST 2V
3 series · 3 of 3 positions shown · non-contrast
Comparison: 09/04/2018

CLINICAL DATA: 65-year-old male with a history of cough

EXAM:
CHEST - 2 VIEW

[w chest pa]
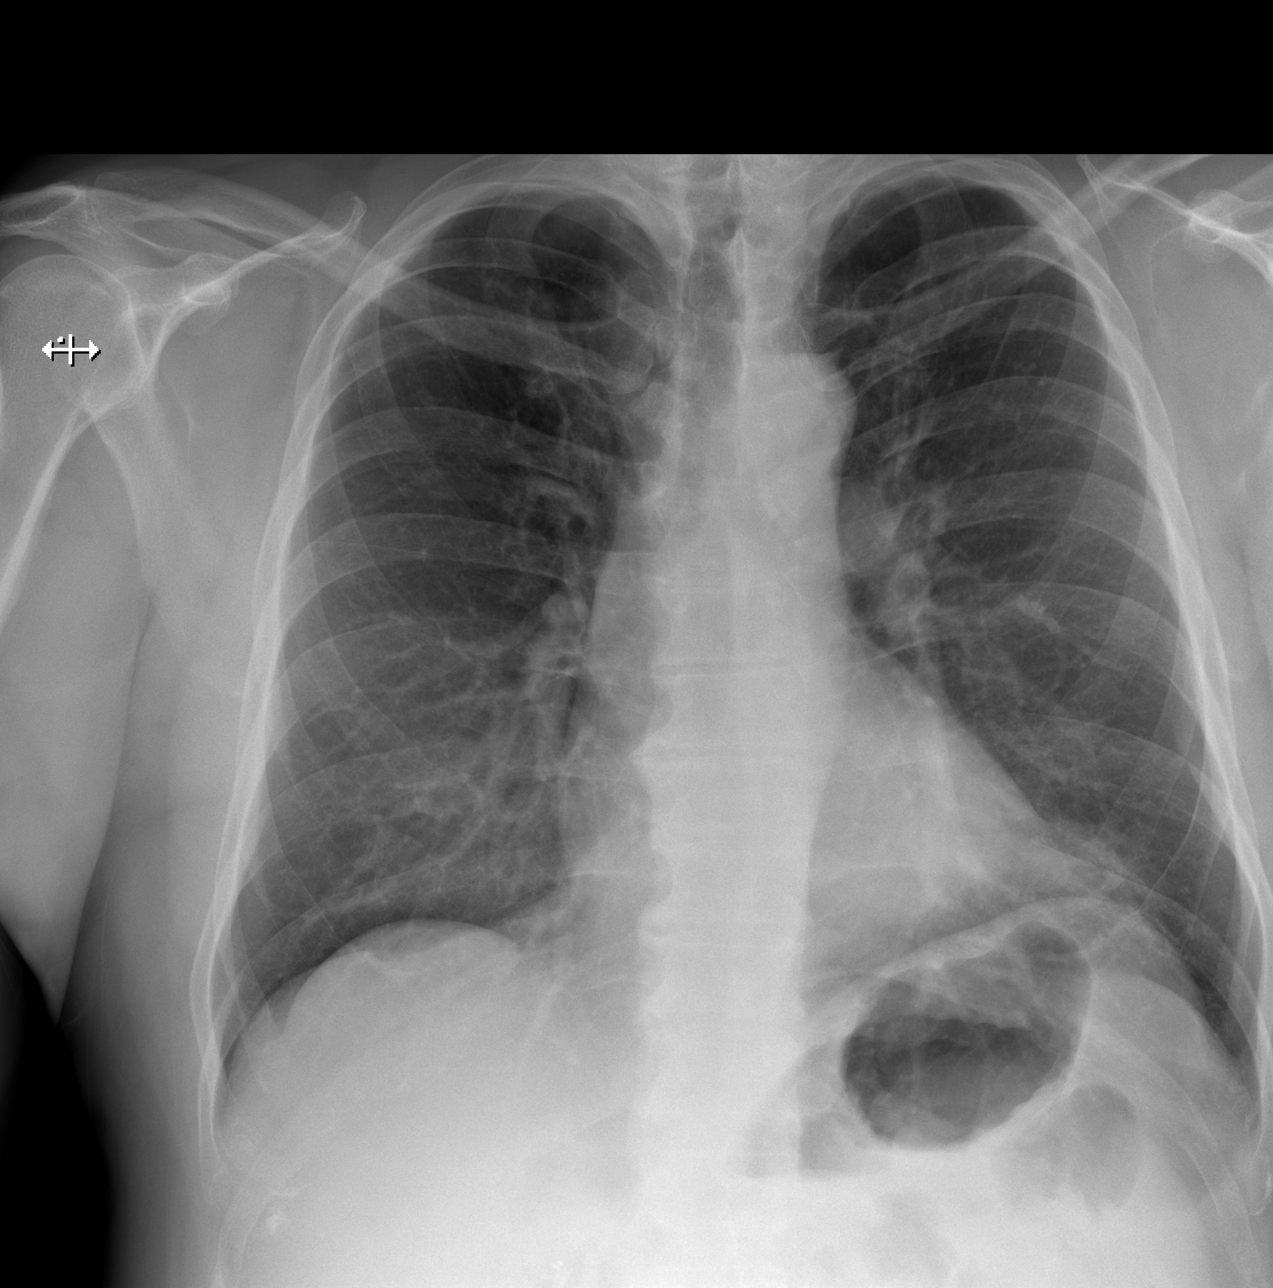

[w chest lat (1 of 2)]
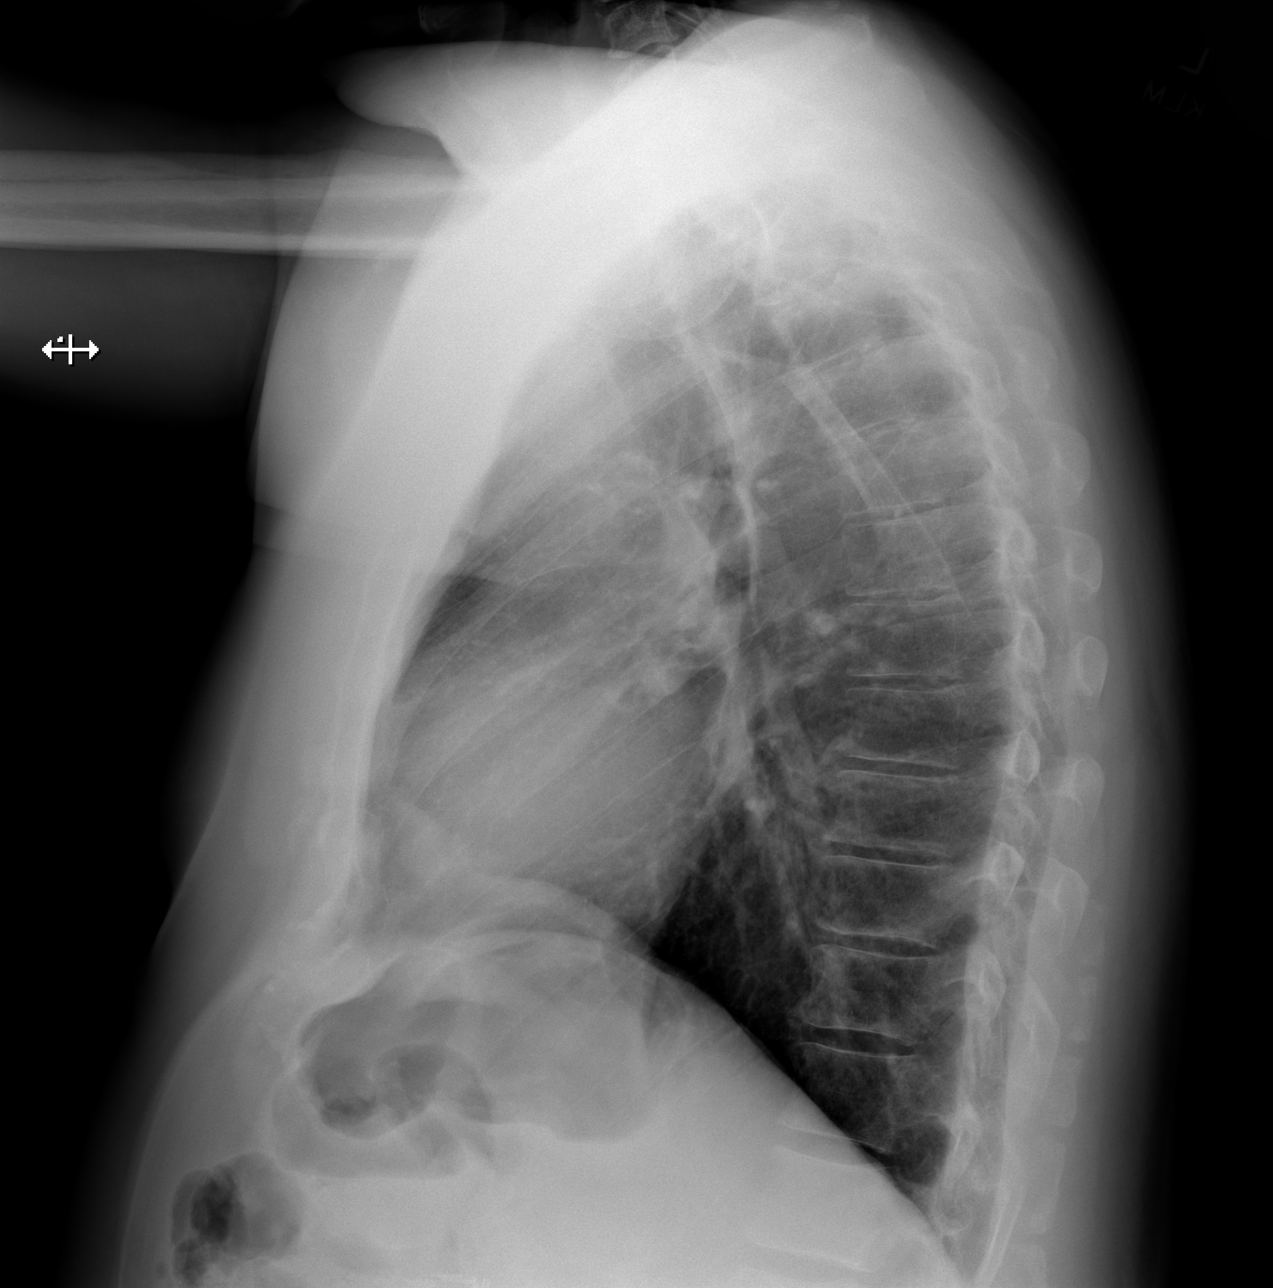

[w chest lat (2 of 2)]
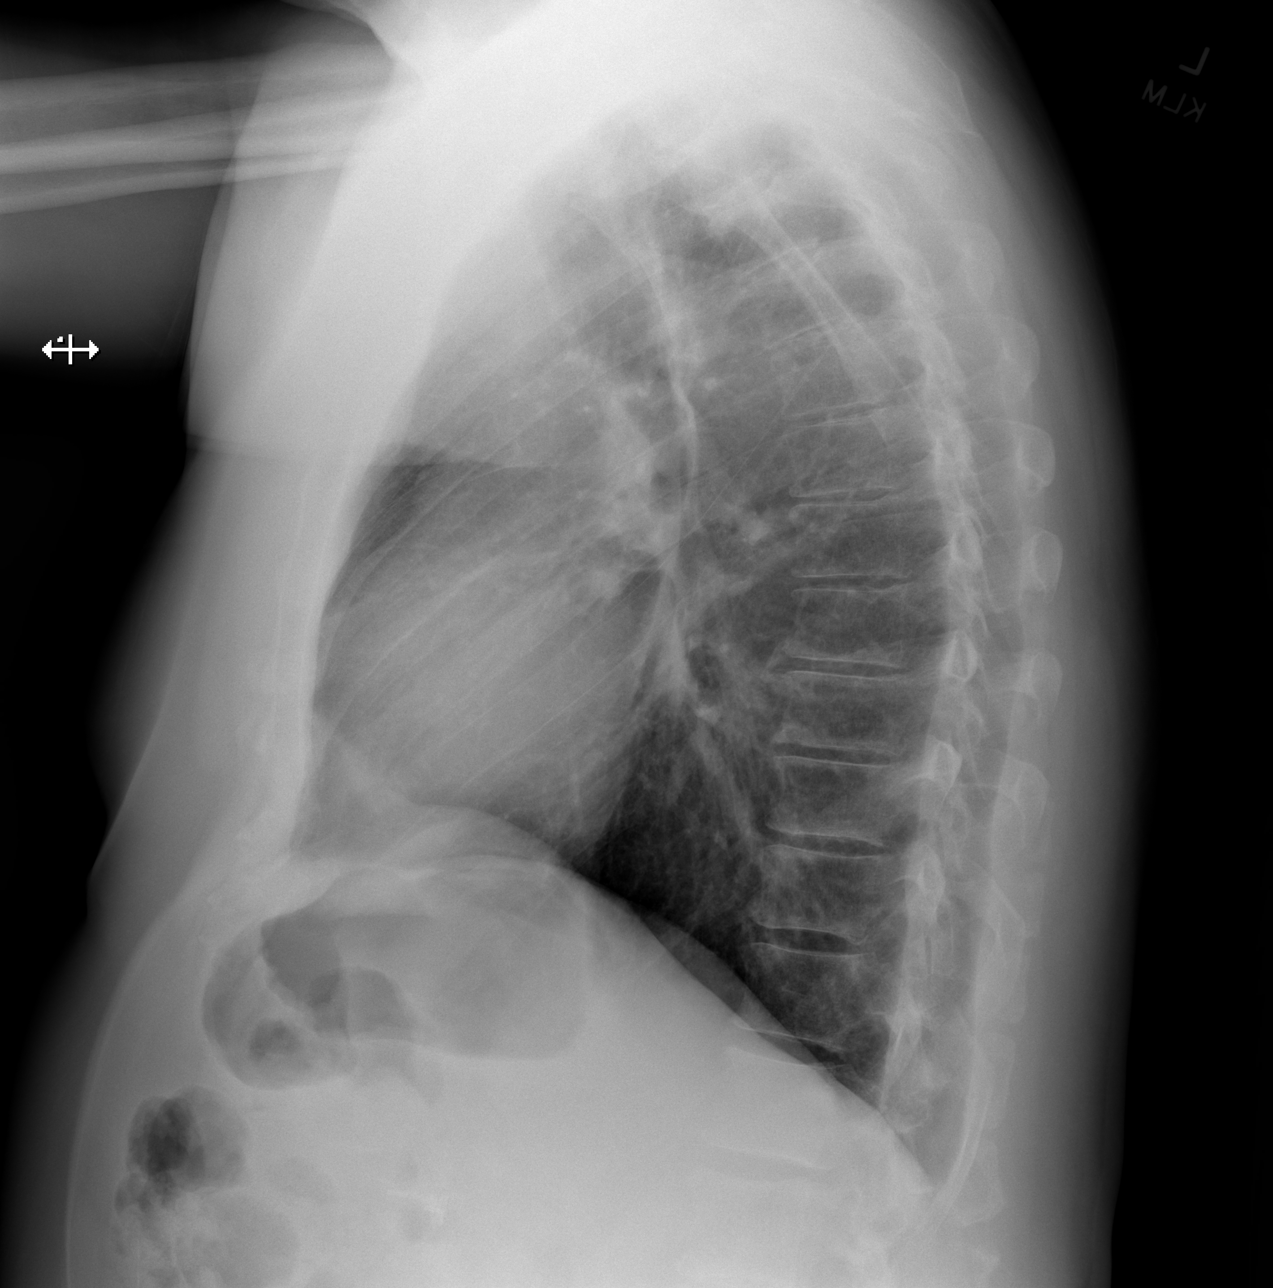

[3 of 3 positions shown; findings below may reference images not displayed]

FINDINGS: Cardiomediastinal silhouette unchanged in size and contour. No
evidence of central vascular congestion. No interlobular septal
thickening.

No pneumothorax or pleural effusion. Coarsened interstitial
markings, with no confluent airspace disease.

No acute displaced fracture. Degenerative changes of the spine.
IMPRESSION: No active cardiopulmonary disease.

## 2023-06-15 DIAGNOSIS — J029 Acute pharyngitis, unspecified: Secondary | ICD-10-CM | POA: Diagnosis not present

## 2023-06-15 DIAGNOSIS — J069 Acute upper respiratory infection, unspecified: Secondary | ICD-10-CM | POA: Diagnosis not present

## 2023-06-15 DIAGNOSIS — R0981 Nasal congestion: Secondary | ICD-10-CM | POA: Diagnosis not present

## 2023-08-29 ENCOUNTER — Other Ambulatory Visit (HOSPITAL_COMMUNITY)
Admission: RE | Admit: 2023-08-29 | Discharge: 2023-08-29 | Disposition: A | Discharge status: Home / Self Care | Payer: BC Managed Care – PPO | Source: Ambulatory Visit | Attending: Family Medicine | Admitting: Family Medicine

## 2023-08-29 ENCOUNTER — Ambulatory Visit (INDEPENDENT_AMBULATORY_CARE_PROVIDER_SITE_OTHER): Payer: BC Managed Care – PPO | Admitting: Student

## 2023-08-29 VITALS — BP 111/78 | HR 77 | Ht 72.0 in | Wt 220.0 lb

## 2023-08-29 DIAGNOSIS — J029 Acute pharyngitis, unspecified: Secondary | ICD-10-CM

## 2023-08-29 LAB — POCT RAPID STREP A (OFFICE): Rapid Strep A Screen: NEGATIVE

## 2023-08-29 LAB — POC SOFIA SARS ANTIGEN FIA: SARS Coronavirus 2 Ag: NEGATIVE

## 2023-08-30 DIAGNOSIS — J029 Acute pharyngitis, unspecified: Secondary | ICD-10-CM | POA: Insufficient documentation

## 2023-08-30 NOTE — Progress Notes (Signed)
    SUBJECTIVE:   CHIEF COMPLAINT / HPI:   Pharyngitis Second bout in a month or so. One month ago had severe pharyngitis that progressed to the point that even drinking water was very painful. Was seen at [facility] facility where COVID and Strep (and strep culture) were all negative. Symptoms improved with time but then yesterday he had a recurrence in his pharyngitis.  He has an 67yo and 67yo at home but no one else at home is sick.  No fevers, cough, runny nose or other associated URI symptoms.  He's a never smoker and not a heavy drinker. Denies any personal or family history of ENT cancers, though did have skin cancer of the nose in the past.   OBJECTIVE:   BP 111/78 (BP Location: Left Arm, Patient Position: Sitting, Cuff Size: Normal)   Pulse 77   Ht 6' (1.829 m)   Wt 220 lb (99.8 kg)   SpO2 98%   BMI 29.84 kg/m   Physical Exam Vitals reviewed.  Constitutional:      General: He is not in acute distress. HENT:     Nose: No congestion or rhinorrhea.     Mouth/Throat:     Comments: Oropharynx has some generalized erythema but is without exudate or tonsillar hypertrophy. There are no lesions to make me suspicious for throat CA.   Eyes:     Conjunctiva/sclera: Conjunctivae normal.  Pulmonary:     Effort: Pulmonary effort is normal. No respiratory distress.  Lymphadenopathy:     Cervical: No cervical adenopathy.      ASSESSMENT/PLAN:   Pharyngitis Almost certainly viral in etiology. Suspect perhaps his kids brought something home from school. The recurrence within just one month gives me pause. He is not a set up for throat CA given no smoking, drinking, or family history.  - Will repeat swabs for COVID, strep here--both neg - Will swab for GC/Chlamydia - If all of the above is negative, would think about trial of nasal antihistamine + steroid with the thought that this may be a post-nasal drip driven process      J Dorothyann Gibbs, MD [facility]

## 2023-08-30 NOTE — Assessment & Plan Note (Signed)
Almost certainly viral in etiology. Suspect perhaps his kids brought something home from school. The recurrence within just one month gives me pause. He is not a set up for throat CA given no smoking, drinking, or family history.  - Will repeat swabs for COVID, strep here--both neg - Will swab for GC/Chlamydia - If all of the above is negative, would think about trial of nasal antihistamine + steroid with the thought that this may be a post-nasal drip driven process

## 2023-08-31 ENCOUNTER — Encounter: Payer: Self-pay | Admitting: Student

## 2023-08-31 DIAGNOSIS — J029 Acute pharyngitis, unspecified: Secondary | ICD-10-CM

## 2023-09-02 LAB — CERVICOVAGINAL ANCILLARY ONLY
Chlamydia: NEGATIVE
Comment: NEGATIVE
Comment: NORMAL
Neisseria Gonorrhea: NEGATIVE

## 2023-09-02 MED ORDER — AZELASTINE-FLUTICASONE 137-50 MCG/ACT NA SUSP: 1.0000 | Freq: Two times a day (BID) | NASAL | 1 refills | Status: AC

## 2023-10-22 ENCOUNTER — Encounter (INDEPENDENT_AMBULATORY_CARE_PROVIDER_SITE_OTHER): Payer: Self-pay

## 2023-10-22 ENCOUNTER — Ambulatory Visit (INDEPENDENT_AMBULATORY_CARE_PROVIDER_SITE_OTHER): Payer: BC Managed Care – PPO

## 2023-10-22 VITALS — Ht 72.0 in | Wt 220.0 lb

## 2023-10-22 DIAGNOSIS — J029 Acute pharyngitis, unspecified: Secondary | ICD-10-CM | POA: Diagnosis not present

## 2023-10-22 DIAGNOSIS — K219 Gastro-esophageal reflux disease without esophagitis: Secondary | ICD-10-CM

## 2023-10-22 DIAGNOSIS — J309 Allergic rhinitis, unspecified: Secondary | ICD-10-CM

## 2023-10-22 MED ORDER — PANTOPRAZOLE SODIUM 40 MG PO TBEC: 40.0000 mg | DELAYED_RELEASE_TABLET | Freq: Every day | ORAL | 3 refills | Status: AC

## 2023-10-22 MED ORDER — SULFAMETHOXAZOLE-TRIMETHOPRIM 800-160 MG PO TABS: 1.0000 | ORAL_TABLET | Freq: Two times a day (BID) | ORAL | 0 refills | Status: AC

## 2023-10-22 NOTE — Progress Notes (Signed)
Dear Dr. Laroy Apple, Here is my assessment for our mutual patient, Christopher [name]. Thank you for allowing me the opportunity to care for your patient. Please do not hesitate to contact me should you have any other questions. Sincerely, Dr. Jovita [name]  Otolaryngology Clinic Note Referring provider: Dr. Laroy Apple HPI:  Christopher [name] is a 67 y.o. male kindly referred by Dr. Laroy Apple for evaluation of recurrent pharyngitis.  He reports that he has had several episodes of throat pain over past few months. Does not feel like it's a cold, and it feels like his throat gets more and more sore and more and more constricted. He was tested for COVID/Strep which was unrevealing. No sick contacts. Does not have any cough/congestion/PND for them. He normally uses aspirin and claritin/zyrtec. Currently mostly asymptomatic.   He currently uses flonase. Nothing really seems to help. Between episodes he feels normal - worst one was 6 days, and normally lasts 3 days. No SOB.   Has some odynophagia. No voice change, weight loss, SOB, neck masses, oral cavity masses, hemoptysis.  Prior allergy testing: mold and dust mites. Moving to new home and symptoms started afterwards, so wonders if maybe allergies have precipitated this  No significant smoking of alcohol use history.  H&N Surgery: no Personal or FHx of bleeding dz or anesthesia difficulty: no   AP/AC: no  PMHx: HLD  Tobacco: no. Alcohol: denies  PMHx: AR,   Independent Review of Additional Tests or Records:  Labs reviewed: 08/2023: GC, Strep, COVID neg Reviewed primary care notes, multiple episodes of pharyngitis and presumed sinusitis over psat few years  PMH/Meds/All/SocHx/FamHx/ROS:   Past Medical History:  Diagnosis Date   Microscopic hematuria 06/14/2009   Qualifier: Diagnosis of  By: Mauricio Po MD, James     OTHER DISEASES OF LUNG NOT ELSEWHERE CLASSIFIED 03/04/2008   Qualifier: Diagnosis of  By: Lorenda Hatchet CMA,, Renato Battles       Past Surgical  History:  Procedure Laterality Date   CHOLECYSTECTOMY, LAPAROSCOPIC  2005   LASIK  2004    History reviewed. No pertinent family history.   Social Connections: Unknown (06/15/2023)   Received from [facility]   Social Network    Social Network: Not on file      Current Outpatient Medications:    Azelastine-Fluticasone 137-50 MCG/ACT SUSP, Place 1 spray into the nose 2 (two) times daily., Disp: 23 g, Rfl: 1   loratadine (CLARITIN) 5 MG chewable tablet, Chew 5 mg by mouth daily., Disp: , Rfl:    cetirizine (ZYRTEC) 10 MG tablet, Take 10 mg by mouth daily. (Patient not taking: Reported on 10/22/2023), Disp: , Rfl:    oxymetazoline (AFRIN) 0.05 % nasal spray, Place 2 sprays into the nose 2 (two) times daily. (Patient not taking: Reported on 09/05/2018), Disp: 30 mL, Rfl: 0   Physical Exam:   Ht 6' (1.829 m)   Wt 220 lb (99.8 kg)   BMI 29.84 kg/m   Salient findings:  CN II-XII intact  Bilateral EAC clear and TM intact with well pneumatized middle ear spaces Anterior rhinoscopy: Septum relatively midline; no masses noted No lesions of oral cavity/oropharynx; dentition fair No obviously palpable neck masses/lymphadenopathy/thyromegaly No respiratory distress or stridor; voice quality class I; given chief complaint, TFL was indicated to better evaluate the larynx and pharynx and is documented below  Seprately Identifiable Procedures:  Procedure Note Pre-procedure diagnosis:  Recurrent pharyngitis, throat pain Post-procedure diagnosis: Same Procedure: Transnasal Fiberoptic Laryngoscopy, CPT 09811 - Mod 25 Indication: see above Complications: None apparent  EBL: 0 mL  The procedure was undertaken to further evaluate the patient's complaint of recurrent pharyngititis and throat pain, with mirror exam inadequate for appropriate examination due to gag reflex and poor patient tolerance  Procedure:  Patient was identified as correct patient. Verbal consent was obtained. The nose was  sprayed with oxymetazoline and 4% lidocaine. The The flexible laryngoscope was passed through the nose to view the nasal cavity, pharynx (oropharynx, hypopharynx) and larynx.  The larynx was examined at rest and during multiple phonatory tasks. Documentation was obtained and reviewed with patient. The scope was removed. The patient tolerated the procedure well.  Findings: The nasal cavity and nasopharynx did not reveal any masses or lesions, mucosa appeared to be without obvious lesions. The tongue base, pharyngeal walls, piriform sinuses, vallecula, epiglottis and postcricoid region are normal in appearance without significant masses, evidence of infection or erythema or retained secretions - query little discolored spot over right tongue base and lateral pharyngeal wall (likely just normal variant?). The visualized portion of the subglottis and proximal trachea is widely patent. The vocal folds are mobile bilaterally. There are no lesions on the free edge of the vocal folds nor elsewhere in the larynx worrisome for malignancy.         Electronically signed by: Read Drivers, MD 10/27/2023 10:17 AM   Impression & Plans:  [name] is a 67 y.o. male with history of allergic rhinitis now with: Recurrent sore throat and pharyngitis Allergic Rhinitis TFL is overall reassuring and the small spots do not appear pathologic. We discussed that there can be several causes which may contribute to this and Ddx is wide: recurrent viral pharyngitis, allergies (perhaps playing a role - see above, given recent move and now symptoms afterward but not highest on ddx), LPR. He does not have obvious masses or signs of malignancy or Rfx for it, and symptoms do not correlate to that either. We discussed that we can empirically treat him with bactrim to make sure these small areas are not MRSA, and can start him on a trial of PPI to ensure that reflux is not playing a role. He agrees - Start bactrim BID x10d -  Start pantoprazole 40mg  daily - Given his AR symptoms, he would like to see allergy as a possible contributing cause, which I will refer him to Allergy and Imm  See below regarding exact medications prescribed this encounter including dosages and route: Meds ordered this encounter  Medications   sulfamethoxazole-trimethoprim (BACTRIM DS) 800-160 MG tablet    Sig: Take 1 tablet by mouth 2 (two) times daily.    Dispense:  10 tablet    Refill:  0   pantoprazole (PROTONIX) 40 MG tablet    Sig: Take 1 tablet (40 mg total) by mouth daily.    Dispense:  30 tablet    Refill:  3    - f/u in 4-6 months for recheck, sooner if any other concerns   Thank you for allowing me the opportunity to care for your patient. Please do not hesitate to contact me should you have any other questions.  Sincerely, Christopher Kussmaul, MD Otolarynoglogist (ENT), [facility] Phone: [phone] Fax: [phone]  10/22/2023, 1:36 PM

## 2023-12-02 DIAGNOSIS — E7800 Pure hypercholesterolemia, unspecified: Secondary | ICD-10-CM | POA: Diagnosis not present

## 2023-12-02 DIAGNOSIS — Z85828 Personal history of other malignant neoplasm of skin: Secondary | ICD-10-CM | POA: Diagnosis not present

## 2023-12-02 DIAGNOSIS — Z0001 Encounter for general adult medical examination with abnormal findings: Secondary | ICD-10-CM | POA: Diagnosis not present

## 2023-12-02 DIAGNOSIS — Z0000 Encounter for general adult medical examination without abnormal findings: Secondary | ICD-10-CM | POA: Diagnosis not present

## 2023-12-02 DIAGNOSIS — E119 Type 2 diabetes mellitus without complications: Secondary | ICD-10-CM | POA: Diagnosis not present

## 2023-12-31 DIAGNOSIS — J069 Acute upper respiratory infection, unspecified: Secondary | ICD-10-CM | POA: Diagnosis not present

## 2023-12-31 DIAGNOSIS — E119 Type 2 diabetes mellitus without complications: Secondary | ICD-10-CM | POA: Diagnosis not present

## 2024-01-01 DIAGNOSIS — U071 COVID-19: Secondary | ICD-10-CM | POA: Diagnosis not present

## 2024-02-27 DIAGNOSIS — E7800 Pure hypercholesterolemia, unspecified: Secondary | ICD-10-CM | POA: Diagnosis not present

## 2024-02-27 DIAGNOSIS — E119 Type 2 diabetes mellitus without complications: Secondary | ICD-10-CM | POA: Diagnosis not present

## 2024-02-27 DIAGNOSIS — G8929 Other chronic pain: Secondary | ICD-10-CM | POA: Diagnosis not present

## 2024-02-27 DIAGNOSIS — R070 Pain in throat: Secondary | ICD-10-CM | POA: Diagnosis not present

## 2024-04-14 DIAGNOSIS — M7541 Impingement syndrome of right shoulder: Secondary | ICD-10-CM | POA: Diagnosis not present

## 2024-04-23 ENCOUNTER — Ambulatory Visit (INDEPENDENT_AMBULATORY_CARE_PROVIDER_SITE_OTHER): Payer: BC Managed Care – PPO | Admitting: Otolaryngology

## 2024-04-23 NOTE — Progress Notes (Deleted)
 Dear Christopher [name], Here is my assessment for our mutual patient, Christopher [name]. Thank you for allowing me the opportunity to care for your patient. Please do not hesitate to contact me should you have any other questions. Sincerely, Christopher [name]  Otolaryngology Clinic Note Referring provider: Dr. Leocadia [name] HPI:  Christopher [name] is a 68 y.o. male kindly referred by Christopher [name] for evaluation of recurrent pharyngitis.  Initial visit (2024): He reports that he has had several episodes of throat pain over past few months. Does not feel like it's a cold, and it feels like his throat gets more and more sore and more and more constricted. He was tested for COVID/Strep which was unrevealing. No sick contacts. Does not have any cough/congestion/PND for them. He normally uses aspirin and claritin/zyrtec. Currently mostly asymptomatic.   He currently uses flonase . Nothing really seems to help. Between episodes he feels normal - worst one was 6 days, and normally lasts 3 days. No SOB.   Has some odynophagia. No voice change, weight loss, SOB, neck masses, oral cavity masses, hemoptysis.  Prior allergy testing: mold and dust mites. Moving to new home and symptoms started afterwards, so wonders if maybe allergies have precipitated this  No significant smoking of alcohol use history. --------------------------------------------------------- 04/23/2024    H&N Surgery: no Personal or FHx of bleeding dz or anesthesia difficulty: no   AP/AC: no  PMHx: HLD  Tobacco: no. Alcohol: denies  PMHx: AR,   Independent Review of Additional Tests or Records:  Labs reviewed: 08/2023: GC, Strep, COVID neg Reviewed primary care notes, multiple episodes of pharyngitis and presumed sinusitis over psat few years  PMH/Meds/All/SocHx/FamHx/ROS:   Past Medical History:  Diagnosis Date   Microscopic hematuria 06/14/2009   Qualifier: Diagnosis of  By: Christopher [name]     OTHER DISEASES OF LUNG NOT ELSEWHERE  CLASSIFIED 03/04/2008   Qualifier: Diagnosis of  By: Christopher [name]       Past Surgical History:  Procedure Laterality Date   CHOLECYSTECTOMY, LAPAROSCOPIC  2005   LASIK  2004    No family history on file.   Social Connections: Unknown (06/15/2023)   Received from Christopher [name]   Social Network    Social Network: Not on file      Current Outpatient Medications:    Azelastine -Fluticasone  137-50 MCG/ACT SUSP, Place 1 spray into the nose 2 (two) times daily., Disp: 23 g, Rfl: 1   cetirizine (ZYRTEC) 10 MG tablet, Take 10 mg by mouth daily. (Patient not taking: Reported on 10/22/2023), Disp: , Rfl:    loratadine (CLARITIN) 5 MG chewable tablet, Chew 5 mg by mouth daily., Disp: , Rfl:    oxymetazoline  (AFRIN) 0.05 % nasal spray, Place 2 sprays into the nose 2 (two) times daily. (Patient not taking: Reported on 09/05/2018), Disp: 30 mL, Rfl: 0   pantoprazole  (PROTONIX ) 40 MG tablet, Take 1 tablet (40 mg total) by mouth daily., Disp: 30 tablet, Rfl: 3   sulfamethoxazole -trimethoprim  (BACTRIM  DS) 800-160 MG tablet, Take 1 tablet by mouth 2 (two) times daily., Disp: 10 tablet, Rfl: 0   Physical Exam:   There were no vitals taken for this visit.  Salient findings:  CN II-XII intact  Bilateral EAC clear and TM intact with well pneumatized middle ear spaces Anterior rhinoscopy: Septum relatively midline; no masses noted No lesions of oral cavity/oropharynx; dentition fair No obviously palpable neck masses/lymphadenopathy/thyromegaly No respiratory distress or stridor; voice quality class I; given chief complaint, TFL was indicated to better evaluate  the larynx and pharynx and is documented below  Seprately Identifiable Procedures:  Procedure Note Pre-procedure diagnosis:  Recurrent pharyngitis, throat pain Post-procedure diagnosis: Same Procedure: Transnasal Fiberoptic Laryngoscopy, CPT 31575 - Mod 25 Indication: see above Complications: None apparent EBL: 0 mL  The procedure was  undertaken to further evaluate the patient's complaint of recurrent pharyngititis and throat pain, with mirror exam inadequate for appropriate examination due to gag reflex and poor patient tolerance  Procedure:  Patient was identified as correct patient. Verbal consent was obtained. The nose was sprayed with oxymetazoline  and 4% lidocaine. The The flexible laryngoscope was passed through the nose to view the nasal cavity, pharynx (oropharynx, hypopharynx) and larynx.  The larynx was examined at rest and during multiple phonatory tasks. Documentation was obtained and reviewed with patient. The scope was removed. The patient tolerated the procedure well.  Findings: The nasal cavity and nasopharynx did not reveal any masses or lesions, mucosa appeared to be without obvious lesions. The tongue base, pharyngeal walls, piriform sinuses, vallecula, epiglottis and postcricoid region are normal in appearance without significant masses, evidence of infection or erythema or retained secretions - query little discolored spot over right tongue base and lateral pharyngeal wall (likely just normal variant?). The visualized portion of the subglottis and proximal trachea is widely patent. The vocal folds are mobile bilaterally. There are no lesions on the free edge of the vocal folds nor elsewhere in the larynx worrisome for malignancy.         Electronically signed by: Christopher [name] 04/23/2024 7:39 AM   Impression & Plans:  Christopher [name] is a 68 y.o. male with history of allergic rhinitis now with: Recurrent sore throat and pharyngitis Allergic Rhinitis TFL is overall reassuring and the small spots do not appear pathologic. We discussed that there can be several causes which may contribute to this and Ddx is wide: recurrent viral pharyngitis, allergies (perhaps playing a role - see above, given recent move and now symptoms afterward but not highest on ddx), LPR. He does not have obvious masses or signs of  malignancy or Rfx for it, and symptoms do not correlate to that either. We discussed that we can empirically treat him with bactrim  to make sure these small areas are not MRSA, and can start him on a trial of PPI to ensure that reflux is not playing a role. He agrees - Start bactrim  BID x10d - Start pantoprazole  40mg  daily - Given his AR symptoms, he would like to see allergy as a possible contributing cause, which I will refer him to Allergy and Imm  See below regarding exact medications prescribed this encounter including dosages and route: No orders of the defined types were placed in this encounter.   - f/u in 4-6 months for recheck, sooner if any other concerns   Thank you for allowing me the opportunity to care for your patient. Please do not hesitate to contact me should you have any other questions.  Sincerely, Christopher Aloe, [name] Otolarynoglogist (ENT), [name] Phone: [phone] Fax: [phone]  04/23/2024, 7:39 AM

## 2024-05-26 ENCOUNTER — Encounter: Payer: Self-pay | Admitting: *Deleted

## 2024-07-21 DIAGNOSIS — E119 Type 2 diabetes mellitus without complications: Secondary | ICD-10-CM | POA: Diagnosis not present

## 2024-07-21 DIAGNOSIS — Z1331 Encounter for screening for depression: Secondary | ICD-10-CM | POA: Diagnosis not present

## 2024-07-21 DIAGNOSIS — E7800 Pure hypercholesterolemia, unspecified: Secondary | ICD-10-CM | POA: Diagnosis not present

## 2024-07-21 DIAGNOSIS — Z0000 Encounter for general adult medical examination without abnormal findings: Secondary | ICD-10-CM | POA: Diagnosis not present

## 2024-07-21 DIAGNOSIS — J301 Allergic rhinitis due to pollen: Secondary | ICD-10-CM | POA: Diagnosis not present

## 2024-12-16 DIAGNOSIS — L821 Other seborrheic keratosis: Secondary | ICD-10-CM | POA: Diagnosis not present

## 2024-12-16 DIAGNOSIS — L723 Sebaceous cyst: Secondary | ICD-10-CM | POA: Diagnosis not present

## 2024-12-16 DIAGNOSIS — D225 Melanocytic nevi of trunk: Secondary | ICD-10-CM | POA: Diagnosis not present
